# Patient Record
Sex: Female | Born: 1965 | Race: Black or African American | Hispanic: No | Marital: Married | Smoking: Never smoker
Health system: Southern US, Community
[De-identification: ages and names within clinical notes are randomized; demographics above are authoritative.]

## PROBLEM LIST (undated history)

## (undated) DIAGNOSIS — E669 Obesity, unspecified: Secondary | ICD-10-CM

## (undated) DIAGNOSIS — R519 Headache, unspecified: Secondary | ICD-10-CM

## (undated) DIAGNOSIS — R232 Flushing: Secondary | ICD-10-CM

## (undated) DIAGNOSIS — Z975 Presence of (intrauterine) contraceptive device: Secondary | ICD-10-CM

## (undated) DIAGNOSIS — R51 Headache: Secondary | ICD-10-CM

## (undated) DIAGNOSIS — N951 Menopausal and female climacteric states: Secondary | ICD-10-CM

## (undated) DIAGNOSIS — R61 Generalized hyperhidrosis: Secondary | ICD-10-CM

## (undated) DIAGNOSIS — D649 Anemia, unspecified: Secondary | ICD-10-CM

## (undated) DIAGNOSIS — E559 Vitamin D deficiency, unspecified: Secondary | ICD-10-CM

## (undated) HISTORY — DX: Headache: R51

## (undated) HISTORY — DX: Anemia, unspecified: D64.9

## (undated) HISTORY — DX: Generalized hyperhidrosis: R61

## (undated) HISTORY — DX: Presence of (intrauterine) contraceptive device: Z97.5

## (undated) HISTORY — DX: Flushing: R23.2

## (undated) HISTORY — DX: Vitamin D deficiency, unspecified: E55.9

## (undated) HISTORY — DX: Obesity, unspecified: E66.9

## (undated) HISTORY — DX: Headache, unspecified: R51.9

## (undated) HISTORY — DX: Menopausal and female climacteric states: N95.1

## (undated) HISTORY — PX: NO PAST SURGERIES: SHX2092

---

## 2001-05-03 ENCOUNTER — Encounter: Payer: Self-pay | Admitting: Family Medicine

## 2001-05-03 ENCOUNTER — Ambulatory Visit (HOSPITAL_COMMUNITY): Admission: RE | Admit: 2001-05-03 | Discharge: 2001-05-03 | Payer: Self-pay | Admitting: Family Medicine

## 2006-06-09 ENCOUNTER — Ambulatory Visit (HOSPITAL_COMMUNITY): Admission: RE | Admit: 2006-06-09 | Discharge: 2006-06-09 | Payer: Self-pay | Admitting: Pediatrics

## 2007-06-12 ENCOUNTER — Ambulatory Visit (HOSPITAL_COMMUNITY): Admission: RE | Admit: 2007-06-12 | Discharge: 2007-06-12 | Payer: Self-pay | Admitting: Pediatrics

## 2008-05-13 ENCOUNTER — Other Ambulatory Visit: Admission: RE | Admit: 2008-05-13 | Discharge: 2008-05-13 | Payer: Self-pay | Admitting: Obstetrics & Gynecology

## 2008-06-13 ENCOUNTER — Ambulatory Visit (HOSPITAL_COMMUNITY): Admission: RE | Admit: 2008-06-13 | Discharge: 2008-06-13 | Payer: Self-pay | Admitting: Pediatrics

## 2009-06-15 ENCOUNTER — Ambulatory Visit (HOSPITAL_COMMUNITY): Admission: RE | Admit: 2009-06-15 | Discharge: 2009-06-15 | Payer: Self-pay | Admitting: Pediatrics

## 2009-10-06 ENCOUNTER — Other Ambulatory Visit: Admission: RE | Admit: 2009-10-06 | Discharge: 2009-10-06 | Payer: Self-pay | Admitting: Obstetrics & Gynecology

## 2010-05-24 ENCOUNTER — Other Ambulatory Visit (HOSPITAL_COMMUNITY): Payer: Self-pay | Admitting: Pediatrics

## 2010-05-24 DIAGNOSIS — Z139 Encounter for screening, unspecified: Secondary | ICD-10-CM

## 2010-06-21 ENCOUNTER — Ambulatory Visit (HOSPITAL_COMMUNITY)
Admission: RE | Admit: 2010-06-21 | Discharge: 2010-06-21 | Disposition: A | Discharge status: Home / Self Care | Payer: BC Managed Care – PPO | Source: Ambulatory Visit | Attending: Pediatrics | Admitting: Pediatrics

## 2010-06-21 DIAGNOSIS — Z1231 Encounter for screening mammogram for malignant neoplasm of breast: Secondary | ICD-10-CM | POA: Insufficient documentation

## 2010-06-21 DIAGNOSIS — Z139 Encounter for screening, unspecified: Secondary | ICD-10-CM

## 2010-10-11 ENCOUNTER — Other Ambulatory Visit (HOSPITAL_COMMUNITY)
Admission: RE | Admit: 2010-10-11 | Discharge: 2010-10-11 | Disposition: A | Discharge status: Home / Self Care | Payer: BC Managed Care – PPO | Source: Ambulatory Visit | Attending: Obstetrics & Gynecology | Admitting: Obstetrics & Gynecology

## 2010-10-11 DIAGNOSIS — Z01419 Encounter for gynecological examination (general) (routine) without abnormal findings: Secondary | ICD-10-CM | POA: Insufficient documentation

## 2011-07-01 ENCOUNTER — Other Ambulatory Visit (HOSPITAL_COMMUNITY): Payer: Self-pay | Admitting: Pediatrics

## 2011-07-01 DIAGNOSIS — Z139 Encounter for screening, unspecified: Secondary | ICD-10-CM

## 2011-07-07 ENCOUNTER — Ambulatory Visit (HOSPITAL_COMMUNITY)
Admission: RE | Admit: 2011-07-07 | Discharge: 2011-07-07 | Disposition: A | Discharge status: Home / Self Care | Payer: BC Managed Care – PPO | Source: Ambulatory Visit | Attending: Pediatrics | Admitting: Pediatrics

## 2011-07-07 DIAGNOSIS — Z1231 Encounter for screening mammogram for malignant neoplasm of breast: Secondary | ICD-10-CM | POA: Insufficient documentation

## 2011-07-07 DIAGNOSIS — Z139 Encounter for screening, unspecified: Secondary | ICD-10-CM

## 2011-12-01 ENCOUNTER — Other Ambulatory Visit (HOSPITAL_COMMUNITY)
Admission: RE | Admit: 2011-12-01 | Discharge: 2011-12-01 | Disposition: A | Discharge status: Home / Self Care | Payer: BC Managed Care – PPO | Source: Ambulatory Visit | Attending: Obstetrics and Gynecology | Admitting: Obstetrics and Gynecology

## 2011-12-01 DIAGNOSIS — Z01419 Encounter for gynecological examination (general) (routine) without abnormal findings: Secondary | ICD-10-CM | POA: Insufficient documentation

## 2011-12-01 DIAGNOSIS — Z1151 Encounter for screening for human papillomavirus (HPV): Secondary | ICD-10-CM | POA: Insufficient documentation

## 2012-09-19 ENCOUNTER — Other Ambulatory Visit: Payer: Self-pay | Admitting: Adult Health

## 2012-09-19 DIAGNOSIS — Z139 Encounter for screening, unspecified: Secondary | ICD-10-CM

## 2012-09-20 ENCOUNTER — Ambulatory Visit (HOSPITAL_COMMUNITY)
Admission: RE | Admit: 2012-09-20 | Discharge: 2012-09-20 | Disposition: A | Discharge status: Home / Self Care | Payer: BC Managed Care – PPO | Source: Ambulatory Visit | Attending: Adult Health | Admitting: Adult Health

## 2012-09-20 DIAGNOSIS — Z1231 Encounter for screening mammogram for malignant neoplasm of breast: Secondary | ICD-10-CM | POA: Insufficient documentation

## 2012-09-20 DIAGNOSIS — Z139 Encounter for screening, unspecified: Secondary | ICD-10-CM

## 2012-10-03 ENCOUNTER — Encounter: Payer: Self-pay | Admitting: Family Medicine

## 2012-10-03 ENCOUNTER — Ambulatory Visit (HOSPITAL_COMMUNITY)
Admission: RE | Admit: 2012-10-03 | Discharge: 2012-10-03 | Disposition: A | Discharge status: Home / Self Care | Payer: BC Managed Care – PPO | Source: Ambulatory Visit | Attending: Family Medicine | Admitting: Family Medicine

## 2012-10-03 ENCOUNTER — Ambulatory Visit (INDEPENDENT_AMBULATORY_CARE_PROVIDER_SITE_OTHER): Payer: BC Managed Care – PPO | Admitting: Family Medicine

## 2012-10-03 VITALS — BP 126/74 | Ht 67.0 in | Wt 211.4 lb

## 2012-10-03 DIAGNOSIS — R509 Fever, unspecified: Secondary | ICD-10-CM | POA: Insufficient documentation

## 2012-10-03 DIAGNOSIS — R61 Generalized hyperhidrosis: Secondary | ICD-10-CM | POA: Insufficient documentation

## 2012-10-03 DIAGNOSIS — R110 Nausea: Secondary | ICD-10-CM

## 2012-10-03 LAB — POCT URINE PREGNANCY: Preg Test, Ur: NEGATIVE

## 2012-10-03 LAB — POCT URINALYSIS DIPSTICK
Bilirubin, UA: NEGATIVE
Ketones, UA: NEGATIVE
Leukocytes, UA: NEGATIVE
Nitrite, UA: NEGATIVE

## 2012-10-03 LAB — CBC WITH DIFFERENTIAL/PLATELET
Basophils Absolute: 0 10*3/uL (ref 0.0–0.1)
Basophils Relative: 0 % (ref 0–1)
Eosinophils Relative: 2 % (ref 0–5)
HCT: 35.9 % — ABNORMAL LOW (ref 36.0–46.0)
Lymphocytes Relative: 38 % (ref 12–46)
MCHC: 32.3 g/dL (ref 30.0–36.0)
MCV: 86.5 fL (ref 78.0–100.0)
Monocytes Absolute: 0.3 10*3/uL (ref 0.1–1.0)
Platelets: 243 10*3/uL (ref 150–400)
RDW: 13 % (ref 11.5–15.5)
WBC: 4.9 10*3/uL (ref 4.0–10.5)

## 2012-10-03 NOTE — Progress Notes (Deleted)
CC - headaches and night sweats for 3-4 days  HPI -

## 2012-10-03 NOTE — Patient Instructions (Signed)
Fever   Fever is a higher-than-normal body temperature. A normal temperature varies with:   Age.   How it is measured (mouth, underarm, rectal, or ear).   Time of day.  In an adult, an oral temperature around 98.6 Fahrenheit (F) or 37 Celsius (C) is considered normal. A rise in temperature of about 1.8 F or 1 C is generally considered a fever (100.4 F or 38 C). In an infant age 49 days or less, a rectal temperature of 100.4 F (38 C) generally is regarded as fever. Fever is not a disease but can be a symptom of illness.  CAUSES    Fever is most commonly caused by infection.   Some non-infectious problems can cause fever. For example:   Some arthritis problems.   Problems with the thyroid or adrenal glands.   Immune system problems.   Some kinds of cancer.   A reaction to certain medicines.   Occasionally, the source of a fever cannot be determined. This is sometimes called a "Fever of Unknown Origin" (FUO).   Some situations may lead to a temporary rise in body temperature that may go away on its own. Examples are:   Childbirth.   Surgery.   Some situations may cause a rise in body temperature but these are not considered "true fever". Examples are:   Intense exercise.   Dehydration.   Exposure to high outside or room temperatures.  SYMPTOMS    Feeling warm or hot.   Fatigue or feeling exhausted.   Aching all over.   Chills.   Shivering.   Sweats.  DIAGNOSIS   A fever can be suspected by your caregiver feeling that your skin is unusually warm. The fever is confirmed by taking a temperature with a thermometer. Temperatures can be taken different ways. Some methods are accurate and some are not:  With adults, adolescents, and children:    An oral temperature is used most commonly.   An ear thermometer will only be accurate if it is positioned as recommended by the manufacturer.   Under the arm temperatures are not accurate and not recommended.   Most electronic thermometers are fast  and accurate.  Infants and Toddlers:   Rectal temperatures are recommended and most accurate.   Ear temperatures are not accurate in this age group and are not recommended.   Skin thermometers are not accurate.  RISKS AND COMPLICATIONS    During a fever, the body uses more oxygen, so a person with a fever may develop rapid breathing or shortness of breath. This can be dangerous especially in people with heart or lung disease.   The sweats that occur following a fever can cause dehydration.   High fever can cause seizures in infants and children.   Older persons can develop confusion during a fever.  TREATMENT    Medications may be used to control temperature.   Do not give aspirin to children with fevers. There is an association with Reye's syndrome. Reye's syndrome is a rare but potentially deadly disease.   If an infection is present and medications have been prescribed, take them as directed. Finish the full course of medications until they are gone.   Sponging or bathing with room-temperature water may help reduce body temperature. Do not use ice water or alcohol sponge baths.   Do not over-bundle children in blankets or heavy clothes.   Drinking adequate fluids during an illness with fever is important to prevent dehydration.  HOME CARE INSTRUCTIONS      For adults, rest and adequate fluid intake are important. Dress according to how you feel, but do not over-bundle.   Drink enough water and/or fluids to keep your urine clear or pale yellow.   For infants over 3 months and children, giving medication as directed by your caregiver to control fever can help with comfort. The amount to be given is based on the child's weight. Do NOT give more than is recommended.  SEEK MEDICAL CARE IF:    You or your child are unable to keep fluids down.   Vomiting or diarrhea develops.   You develop a skin rash.   An oral temperature above 102 F (38.9 C) develops, or a fever which persists for over 3  days.   You develop excessive weakness, dizziness, fainting or extreme thirst.   Fevers keep coming back after 3 days.  SEEK IMMEDIATE MEDICAL CARE IF:    Shortness of breath or trouble breathing develops   You pass out.   You feel you are making little or no urine.   New pain develops that was not there before (such as in the head, neck, chest, back, or abdomen).   You cannot hold down fluids.   Vomiting and diarrhea persist for more than a day or two.   You develop a stiff neck and/or your eyes become sensitive to light.   An unexplained temperature above 102 F (38.9 C) develops.  Document Released: 01/17/2005 Document Revised: 04/11/2011 Document Reviewed: 01/03/2008  ExitCare Patient Information 2014 [employer].

## 2012-10-04 ENCOUNTER — Encounter: Payer: Self-pay | Admitting: Family Medicine

## 2012-10-04 LAB — COMPREHENSIVE METABOLIC PANEL
ALT: 16 U/L (ref 0–35)
AST: 20 U/L (ref 0–37)
CO2: 25 mEq/L (ref 19–32)
Calcium: 8.9 mg/dL (ref 8.4–10.5)
Chloride: 107 mEq/L (ref 96–112)
Sodium: 142 mEq/L (ref 135–145)
Total Bilirubin: 0.3 mg/dL (ref 0.3–1.2)
Total Protein: 6.5 g/dL (ref 6.0–8.3)

## 2012-10-04 NOTE — Progress Notes (Signed)
  Subjective:    Patient ID: Leslie Spears, female    DOB: 1965/07/16, 47 y.o.   MRN: [id number]  HPI Pt here with c/o headaches and night sweats for 3-4 days as well as malaise. She had simialr sx a few years ago and was told she had a bacterial infection but is not sure what type.  Headaches have been dull and bitemporal, no vision changes, asociated with nausea, relieved by rest but tend to be worse at night. She drinks no caffeiene and has not made any changes in caffeiene recently. She drinks enough water that her urine is near clear daily. No dietary changes.   night sweats 3-4 days, none before, has not had skipping periods or other perimenopausal sx (unsure age of her mom with menopause due to early hysterectomy). Feels very hot, gets up and stands under the fan. Has not required changing clothes or linens. Has not checked temperature.     Review of Systems  Respiratory: Negative for cough, chest tightness and shortness of breath.   Cardiovascular: Negative for chest pain and palpitations.  Genitourinary: Negative for dysuria.       Objective:   Physical Exam  Nursing note and vitals reviewed. Constitutional: She is oriented to person, place, and time. She appears well-developed and well-nourished.  Cardiovascular: Normal rate, regular rhythm and normal heart sounds.   Pulmonary/Chest: Effort normal and breath sounds normal.  Abdominal: Soft. Bowel sounds are normal. She exhibits no distension. There is no tenderness.  Neurological: She is alert and oriented to person, place, and time. She displays normal reflexes. No cranial nerve deficit. She exhibits normal muscle tone. Coordination normal.  Psychiatric: She has a normal mood and affect.          Assessment & Plan:  Night sweats - Plan: DG Chest 2 View  Fever, unspecified - Plan: POCT urinalysis dipstick, Comprehensive metabolic panel, CBC with Differential, TSH  Nausea alone - Plan: POCT urine pregnancy,  Comprehensive metabolic panel, CBC with Differential, Urine culture  Will have her rtc in 2 days to review labs and see how she is doing. May need some nausea meds for the weekend if not resolving as she is going to a trip.

## 2012-10-05 ENCOUNTER — Encounter: Payer: Self-pay | Admitting: Family Medicine

## 2012-10-05 ENCOUNTER — Ambulatory Visit (INDEPENDENT_AMBULATORY_CARE_PROVIDER_SITE_OTHER): Payer: BC Managed Care – PPO | Admitting: Family Medicine

## 2012-10-05 VITALS — BP 126/74 | Temp 97.8°F | Wt 210.0 lb

## 2012-10-05 DIAGNOSIS — D649 Anemia, unspecified: Secondary | ICD-10-CM

## 2012-10-05 DIAGNOSIS — Z87898 Personal history of other specified conditions: Secondary | ICD-10-CM

## 2012-10-05 DIAGNOSIS — R519 Headache, unspecified: Secondary | ICD-10-CM

## 2012-10-05 DIAGNOSIS — I517 Cardiomegaly: Secondary | ICD-10-CM

## 2012-10-05 DIAGNOSIS — R51 Headache: Secondary | ICD-10-CM

## 2012-10-05 DIAGNOSIS — A491 Streptococcal infection, unspecified site: Secondary | ICD-10-CM

## 2012-10-05 DIAGNOSIS — B951 Streptococcus, group B, as the cause of diseases classified elsewhere: Secondary | ICD-10-CM

## 2012-10-05 LAB — URINE CULTURE

## 2012-10-05 MED ORDER — BUTALBITAL-APAP-CAFFEINE 50-325-40 MG PO TABS: ORAL_TABLET | ORAL | Status: DC

## 2012-10-05 MED ORDER — AMOXICILLIN 875 MG PO TABS: 875.0000 mg | ORAL_TABLET | Freq: Two times a day (BID) | ORAL | Status: DC

## 2012-10-05 MED ORDER — ONDANSETRON HCL 4 MG PO TABS: ORAL_TABLET | ORAL | Status: DC

## 2012-10-05 NOTE — Patient Instructions (Signed)

## 2012-10-05 NOTE — Progress Notes (Signed)
  Subjective:    Patient ID: Leslie Spears, female    DOB: 05-02-1965, 47 y.o.   MRN: [id number]  HPI Pt here to f/u on "feeling hot" and having headaches. Sx have not changed.   Ucx pos 40Ku for GBS. Pt says she has a h/o UTIs esp after interocurse and this fits that pattern. No frequency, burning, or back pain. She has not had a documented fever. No GI sx aside from the nausea.   Reviewed labs and cxr that we did as w/u for sx.   CXR - mild cardiomegaly she does not think she has a h/o htn (none today) but admits to not being regular with cpe's. No shob or cp. No h/o arrhythmia.   Anemia - mildly low h/h, no periods for 10 years due to being on mirena. No fatigue/colness.   Pt due for cpe. Has h/o low vit D.        Review of Systems per hpi     Objective:   Physical Exam  Nursing note and vitals reviewed. Constitutional: She is oriented to person, place, and time. She appears well-developed and well-nourished.  Cardiovascular: Normal rate and regular rhythm.   Pulmonary/Chest: Effort normal and breath sounds normal.  Abdominal: Soft.  Neurological: She is alert and oriented to person, place, and time. She has normal reflexes.  Skin: Skin is warm and dry.  Psychiatric: She has a normal mood and affect.          Assessment & Plan:  Night sweat/feeling hot - ssupect otherwise asymptomatic uti with ucx being pos for gbs. Will treat with amox. If sx persist will need to investigate further  Cardiomegaly - mild, seen on cxr, will get echo to evaluate further  Anemia - checking anemia panel  Will have her rtc in a few weeks for cpe  Given script for zofran as she is going on long trip and has h/o motion sickness. No h/o arhthymia.

## 2012-10-05 NOTE — Addendum Note (Signed)
Addended by: Rolena Infante on: 10/05/2012 01:39 PM   Modules accepted: Orders

## 2012-10-06 LAB — ANEMIA PANEL
%SAT: 31 % (ref 20–55)
ABS Retic: 50.2 10*3/uL (ref 19.0–186.0)
Iron: 96 ug/dL (ref 42–145)
TIBC: 309 ug/dL (ref 250–470)
UIBC: 213 ug/dL (ref 125–400)
Vitamin B-12: 410 pg/mL (ref 211–911)

## 2012-10-22 ENCOUNTER — Encounter: Payer: Self-pay | Admitting: Adult Health

## 2012-10-22 ENCOUNTER — Ambulatory Visit (INDEPENDENT_AMBULATORY_CARE_PROVIDER_SITE_OTHER): Payer: BC Managed Care – PPO | Admitting: Adult Health

## 2012-10-22 VITALS — BP 120/78 | Ht 67.0 in | Wt 215.0 lb

## 2012-10-22 DIAGNOSIS — N951 Menopausal and female climacteric states: Secondary | ICD-10-CM

## 2012-10-22 DIAGNOSIS — R51 Headache: Secondary | ICD-10-CM

## 2012-10-22 DIAGNOSIS — R232 Flushing: Secondary | ICD-10-CM | POA: Insufficient documentation

## 2012-10-22 DIAGNOSIS — R519 Headache, unspecified: Secondary | ICD-10-CM | POA: Insufficient documentation

## 2012-10-22 HISTORY — DX: Flushing: R23.2

## 2012-10-22 MED ORDER — PROGESTERONE MICRONIZED 200 MG PO CAPS: 200.0000 mg | ORAL_CAPSULE | Freq: Every day | ORAL | Status: DC

## 2012-10-22 MED ORDER — ESTRADIOL 0.1 MG/24HR TD PTTW: 1.0000 | MEDICATED_PATCH | TRANSDERMAL | Status: DC

## 2012-10-22 NOTE — Progress Notes (Signed)
Subjective:     Patient ID: ELLSIE Spears, female   DOB: 1965-04-22, 47 y.o.   MRN: [id number]  HPI Leslie Spears is a 47 year old black female in complaining of headaches and being hot at night and not sleeping well.Has mirena IUD but it should have been removed and reinserted in April.Has seen Dr Joseph Art and had labs.Spots but no periods.  Review of Systems See HPI Reviewed past medical,surgical, social and family history. Reviewed medications and allergies.     Objective:   Physical Exam BP 120/78  Ht 5\' 7"  (1.702 m)  Wt 215 lb (97.523 kg)  BMI 33.67 kg/m2   Talk only, discussed HRT and she wants to try, will get back in to have IUD removed and reinserted with Dr Despina Hidden.Discussed with him. Discussed that with the IUD out of date that may contribute to her symptoms.  Assessment:     Hot flashes Headaches HRT    Plan:    Use condoms Rx minivelle 0.1 mg 1 patch 2 x weekly #8 with 12 refill and will rx prometrium 200 mg 1 at hs #30 with 1 refill til IUD removed and reinserted in 2 weeks Review handout on menopause and keep pap and physical appt in October Call prn

## 2012-10-22 NOTE — Patient Instructions (Signed)

## 2012-11-01 ENCOUNTER — Encounter: Payer: Self-pay | Admitting: Advanced Practice Midwife

## 2012-11-01 ENCOUNTER — Ambulatory Visit (INDEPENDENT_AMBULATORY_CARE_PROVIDER_SITE_OTHER): Payer: BC Managed Care – PPO | Admitting: Advanced Practice Midwife

## 2012-11-01 VITALS — BP 120/82 | Ht 67.0 in | Wt 213.0 lb

## 2012-11-01 DIAGNOSIS — Z30430 Encounter for insertion of intrauterine contraceptive device: Secondary | ICD-10-CM

## 2012-11-01 DIAGNOSIS — Z30432 Encounter for removal of intrauterine contraceptive device: Secondary | ICD-10-CM

## 2012-11-01 DIAGNOSIS — Z3202 Encounter for pregnancy test, result negative: Secondary | ICD-10-CM

## 2012-11-01 LAB — POCT URINE PREGNANCY: Preg Test, Ur: NEGATIVE

## 2012-11-01 NOTE — Progress Notes (Signed)
Leslie Spears is a 47 y.o. year old African American female   who presents for removal and replacement of a Mirena IUD.  It has been 5 1/2 years since her previous IUD placement. She has been on prometrium for uterine stabilization and Mirelle for hot flashes fo r2 weeks.  Is noticing an improvement in the flashes already.  The risks and benefits of the method and placement have been thouroughly reviewed with the patient and all questions were answered.  Specifically the patient is aware of failure rate of 01/998, expulsion of the IUD and of possible perforation.  The patient is aware of irregular bleeding due to the method and understands the incidence of irregular bleeding diminishes with time.   Time out was performed.  A Graves speculum was placed.  The cervix was prepped using Betadine. The strings were found to be  visible.   They were grasped and the Mirena was easily removed. The cervix was then grasped with a tenaculum and the uterus was sounded to 7 cm. The IUD was inserted to 7 cm.  It was pulled back 1 cm and the IUD was disengaged.  The strings were trimmed to 3 cm.  Sonogram was performed and the proper placement of the IUD was verified.  The patient was instructed on signs and symptoms of infection and to check for the strings after each menses or each month.  The patient is to refrain from intercourse for 3 days.

## 2012-11-30 ENCOUNTER — Ambulatory Visit (INDEPENDENT_AMBULATORY_CARE_PROVIDER_SITE_OTHER): Payer: BC Managed Care – PPO | Admitting: Adult Health

## 2012-11-30 ENCOUNTER — Encounter: Payer: BC Managed Care – PPO | Admitting: Family Medicine

## 2012-11-30 ENCOUNTER — Encounter: Payer: Self-pay | Admitting: Adult Health

## 2012-11-30 VITALS — BP 150/84 | HR 78 | Ht 66.0 in | Wt 220.5 lb

## 2012-11-30 DIAGNOSIS — Z1212 Encounter for screening for malignant neoplasm of rectum: Secondary | ICD-10-CM

## 2012-11-30 DIAGNOSIS — E559 Vitamin D deficiency, unspecified: Secondary | ICD-10-CM

## 2012-11-30 DIAGNOSIS — Z975 Presence of (intrauterine) contraceptive device: Secondary | ICD-10-CM

## 2012-11-30 DIAGNOSIS — Z01419 Encounter for gynecological examination (general) (routine) without abnormal findings: Secondary | ICD-10-CM

## 2012-11-30 HISTORY — DX: Vitamin D deficiency, unspecified: E55.9

## 2012-11-30 LAB — HEMOCCULT GUIAC POC 1CARD (OFFICE): Fecal Occult Blood, POC: NEGATIVE

## 2012-11-30 NOTE — Patient Instructions (Signed)
Physical in 1 year Mammogram yearly Colonoscopy at 48  

## 2012-11-30 NOTE — Progress Notes (Signed)
Patient ID: Leslie Spears, female   DOB: [birth year], 47 y.o.   MRN: [id number] History of Present Illness: Leslie Spears is a 47 year old black female married in for physical, had normal pap 12/01/11 with negative HPV.   Current Medications, Allergies, Past Medical History, Past Surgical History, Family History and Social History were reviewed in [employer] record.     Review of Systems: Patient denies any headaches, blurred vision, shortness of breath, chest pain, abdominal pain, problems with bowel movements, urination, or intercourse. No joint pain or mood swings, she stopped the patch after IUD inserted was having some nausea.    Physical Exam:BP 150/84  Pulse 78  Ht 5\' 6"  (1.676 m)  Wt 220 lb 8 oz (100.018 kg)  BMI 35.61 kg/m2 General:  Well developed, well nourished, no acute distress Skin:  Warm and dry Neck:  Midline trachea, normal thyroid Lungs; Clear to auscultation bilaterally Breast:  No dominant palpable mass, retraction, or nipple discharge Cardiovascular: Regular rate and rhythm Abdomen:  Soft, non tender, no hepatosplenomegaly Pelvic:  External genitalia is normal in appearance.  The vagina is normal in appearance.  The cervix is bulbous. +IUD strings. Uterus is felt to be normal size, shape, and contour.  No adnexal masses or tenderness noted. Rectal: Good sphincter tone, no polyps, or hemorrhoids felt.  Hemoccult negative. Extremities:  No swelling or varicosities noted Psych:  No mood changes, alert and cooperative   Impression: Yearly exam no pap IUD in place History vitamin deficiency   Plan: Physical in 1 year Mammogram yearly Colonoscopy at 48  Labs at PCP

## 2013-09-09 ENCOUNTER — Other Ambulatory Visit: Payer: Self-pay | Admitting: Adult Health

## 2013-09-09 DIAGNOSIS — Z1231 Encounter for screening mammogram for malignant neoplasm of breast: Secondary | ICD-10-CM

## 2013-09-27 ENCOUNTER — Ambulatory Visit (HOSPITAL_COMMUNITY): Payer: BC Managed Care – PPO

## 2013-10-04 ENCOUNTER — Ambulatory Visit (HOSPITAL_COMMUNITY)
Admission: RE | Admit: 2013-10-04 | Discharge: 2013-10-04 | Disposition: A | Discharge status: Home / Self Care | Payer: BC Managed Care – PPO | Source: Ambulatory Visit | Attending: Adult Health | Admitting: Adult Health

## 2013-10-04 DIAGNOSIS — Z1231 Encounter for screening mammogram for malignant neoplasm of breast: Secondary | ICD-10-CM | POA: Diagnosis not present

## 2013-10-29 ENCOUNTER — Encounter (INDEPENDENT_AMBULATORY_CARE_PROVIDER_SITE_OTHER): Payer: Self-pay | Admitting: *Deleted

## 2013-12-02 ENCOUNTER — Encounter: Payer: Self-pay | Admitting: Adult Health

## 2013-12-04 ENCOUNTER — Other Ambulatory Visit: Payer: BC Managed Care – PPO | Admitting: Adult Health

## 2013-12-10 ENCOUNTER — Encounter: Payer: Self-pay | Admitting: Adult Health

## 2013-12-10 ENCOUNTER — Ambulatory Visit (INDEPENDENT_AMBULATORY_CARE_PROVIDER_SITE_OTHER): Payer: BC Managed Care – PPO | Admitting: Adult Health

## 2013-12-10 VITALS — BP 118/72 | HR 78 | Ht 66.0 in | Wt 222.0 lb

## 2013-12-10 DIAGNOSIS — Z1212 Encounter for screening for malignant neoplasm of rectum: Secondary | ICD-10-CM

## 2013-12-10 DIAGNOSIS — R232 Flushing: Secondary | ICD-10-CM

## 2013-12-10 DIAGNOSIS — Z01419 Encounter for gynecological examination (general) (routine) without abnormal findings: Secondary | ICD-10-CM

## 2013-12-10 DIAGNOSIS — N951 Menopausal and female climacteric states: Secondary | ICD-10-CM

## 2013-12-10 DIAGNOSIS — Z975 Presence of (intrauterine) contraceptive device: Secondary | ICD-10-CM

## 2013-12-10 HISTORY — DX: Menopausal and female climacteric states: N95.1

## 2013-12-10 HISTORY — DX: Presence of (intrauterine) contraceptive device: Z97.5

## 2013-12-10 LAB — HEMOCCULT GUIAC POC 1CARD (OFFICE): FECAL OCCULT BLD: NEGATIVE

## 2013-12-10 NOTE — Progress Notes (Signed)
Patient ID: Leslie PinksGloria Spears, female   DOB: 07/09/1965, 48 y.o.   MRN: [id number] History of Present Illness: Leslie BondsGloria is a 48 year old black female in for gyn exam.Had normal pap with negative HPV 12/01/11.She has an IUD,it was placed 11/01/12.had spotting about a month ago,but usually no periods, and this is her 3rd IUD.   Current Medications, Allergies, Past Medical History, Past Surgical History, Family History and Social History were reviewed in [facility].     Review of Systems: Patient denies any headaches, blurred vision, shortness of breath, chest pain, abdominal pain, problems with bowel movements, urination, or intercourse. No joint pain or mood swings, some hot flashes, no bad.She got flu shot with Dr Margo AyeHall and also had labs.    Physical Exam:BP 118/72 mmHg  Pulse 78  Ht 5\' 6"  (1.676 m)  Wt 222 lb (100.699 kg)  BMI 35.85 kg/m2 General:  Well developed, well nourished, no acute distress Skin:  Warm and dry Neck:  Midline trachea, normal thyroid Lungs; Clear to auscultation bilaterally Breast:  No dominant palpable mass, retraction, or nipple discharge Cardiovascular: Regular rate and rhythm Abdomen:  Soft, non tender, no hepatosplenomegaly Pelvic:  External genitalia is normal in appearance.  The vagina is normal in appearance. The cervix is bulbous, with + IUD strings at os..  Uterus is felt to be normal size, shape, and contour.  No                adnexal masses or tenderness noted. Rectal: Good sphincter tone, no polyps, or hemorrhoids felt.  Hemoccult negative. Extremities:  No swelling or varicosities noted Psych:  No mood changes,alert and cooperative,seems happy   Impression: Well woman gyn exam no pap IUD in place Peri menopause Hot flashes     Plan: Review handout on peri menopause Pap and physical in 1 year Mammogram yearly Colonoscopy advised Dr Margo AyeHall will set up referral

## 2013-12-10 NOTE — Patient Instructions (Signed)
Pap and physical in  1 year Mammogram yearly Colonoscopy advised Labs with PCP Perimenopause Perimenopause is the time when your body begins to move into the menopause (no menstrual period for 12 straight months). It is a natural process. Perimenopause can begin 2-8 years before the menopause and usually lasts for 1 year after the menopause. During this time, your ovaries may or may not produce an egg. The ovaries vary in their production of estrogen and progesterone hormones each month. This can cause irregular menstrual periods, difficulty getting pregnant, vaginal bleeding between periods, and uncomfortable symptoms. CAUSES  Irregular production of the ovarian hormones, estrogen and progesterone, and not ovulating every month.  Other causes include:  Tumor of the pituitary gland in the brain.  Medical disease that affects the ovaries.  Radiation treatment.  Chemotherapy.  Unknown causes.  Heavy smoking and excessive alcohol intake can bring on perimenopause sooner. SIGNS AND SYMPTOMS   Hot flashes.  Night sweats.  Irregular menstrual periods.  Decreased sex drive.  Vaginal dryness.  Headaches.  Mood swings.  Depression.  Memory problems.  Irritability.  Tiredness.  Weight gain.  Trouble getting pregnant.  The beginning of losing bone cells (osteoporosis).  The beginning of hardening of the arteries (atherosclerosis). DIAGNOSIS  Your health care provider will make a diagnosis by analyzing your age, menstrual history, and symptoms. He or she will do a physical exam and note any changes in your body, especially your female organs. Female hormone tests may or may not be helpful depending on the amount of female hormones you produce and when you produce them. However, other hormone tests may be helpful to rule out other problems. TREATMENT  In some cases, no treatment is needed. The decision on whether treatment is necessary during the perimenopause should be  made by you and your health care provider based on how the symptoms are affecting you and your lifestyle. Various treatments are available, such as:  Treating individual symptoms with a specific medicine for that symptom.  Herbal medicines that can help specific symptoms.  Counseling.  Group therapy. HOME CARE INSTRUCTIONS   Keep track of your menstrual periods (when they occur, how heavy they are, how long between periods, and how long they last) as well as your symptoms and when they started.  Only take over-the-counter or prescription medicines as directed by your health care provider.  Sleep and rest.  Exercise.  Eat a diet that contains calcium (good for your bones) and soy (acts like the estrogen hormone).  Do not smoke.  Avoid alcoholic beverages.  Take vitamin supplements as recommended by your health care provider. Taking vitamin E may help in certain cases.  Take calcium and vitamin D supplements to help prevent bone loss.  Group therapy is sometimes helpful.  Acupuncture may help in some cases. SEEK MEDICAL CARE IF:   You have questions about any symptoms you are having.  You need a referral to a specialist (gynecologist, psychiatrist, or psychologist). SEEK IMMEDIATE MEDICAL CARE IF:   You have vaginal bleeding.  Your period lasts longer than 8 days.  Your periods are recurring sooner than 21 days.  You have bleeding after intercourse.  You have severe depression.  You have pain when you urinate.  You have severe headaches.  You have vision problems. Document Released: 02/25/2004 Document Revised: 11/07/2012 Document Reviewed: 08/16/2012 [facility] Patient Information 2015 [place], [place]. This information is not intended to replace advice given to you by your health care provider. Make sure you discuss  any questions you have with your health care provider.  

## 2014-10-20 ENCOUNTER — Other Ambulatory Visit (INDEPENDENT_AMBULATORY_CARE_PROVIDER_SITE_OTHER): Payer: Self-pay | Admitting: *Deleted

## 2014-10-20 ENCOUNTER — Other Ambulatory Visit: Payer: Self-pay | Admitting: Adult Health

## 2014-10-20 ENCOUNTER — Encounter (INDEPENDENT_AMBULATORY_CARE_PROVIDER_SITE_OTHER): Payer: Self-pay | Admitting: *Deleted

## 2014-10-20 DIAGNOSIS — Z1231 Encounter for screening mammogram for malignant neoplasm of breast: Secondary | ICD-10-CM

## 2014-10-20 DIAGNOSIS — Z1211 Encounter for screening for malignant neoplasm of colon: Secondary | ICD-10-CM

## 2014-10-29 ENCOUNTER — Ambulatory Visit (HOSPITAL_COMMUNITY)
Admission: RE | Admit: 2014-10-29 | Discharge: 2014-10-29 | Disposition: A | Discharge status: Home / Self Care | Payer: 59 | Source: Ambulatory Visit | Attending: Adult Health | Admitting: Adult Health

## 2014-10-29 DIAGNOSIS — Z1231 Encounter for screening mammogram for malignant neoplasm of breast: Secondary | ICD-10-CM | POA: Insufficient documentation

## 2014-12-01 ENCOUNTER — Telehealth (INDEPENDENT_AMBULATORY_CARE_PROVIDER_SITE_OTHER): Payer: Self-pay | Admitting: *Deleted

## 2014-12-01 DIAGNOSIS — Z1211 Encounter for screening for malignant neoplasm of colon: Secondary | ICD-10-CM

## 2014-12-01 MED ORDER — PEG 3350-KCL-NA BICARB-NACL 420 G PO SOLR: 4000.0000 mL | Freq: Once | ORAL | Status: DC

## 2014-12-01 NOTE — Telephone Encounter (Signed)
Patient needs trilyte 

## 2014-12-11 ENCOUNTER — Telehealth (INDEPENDENT_AMBULATORY_CARE_PROVIDER_SITE_OTHER): Payer: Self-pay | Admitting: *Deleted

## 2014-12-11 NOTE — Telephone Encounter (Signed)
Referring MD/PCP: hall   Procedure: tcs  Reason/Indication:  screening  Has patient had this procedure before?  no  If so, when, by whom and where?    Is there a family history of colon cancer?  no  Who?  What age when diagnosed?    Is patient diabetic?   no      Does patient have prosthetic heart valve or mechanical valve?  no  Do you have a pacemaker?  no  Has patient ever had endocarditis? no  Has patient had joint replacement within last 12 months?  no  Does patient tend to be constipated or take laxatives? no  Does patient have a history of alcohol/drug use?  no  Is patient on Coumadin, Plavix and/or Aspirin? no  Medications: none  Allergies: nkda  Medication Adjustment:   Procedure date & time: 01/01/15 at 830

## 2014-12-12 ENCOUNTER — Other Ambulatory Visit (HOSPITAL_COMMUNITY)
Admission: RE | Admit: 2014-12-12 | Discharge: 2014-12-12 | Disposition: A | Discharge status: Home / Self Care | Payer: 59 | Source: Ambulatory Visit | Attending: Adult Health | Admitting: Adult Health

## 2014-12-12 ENCOUNTER — Ambulatory Visit (INDEPENDENT_AMBULATORY_CARE_PROVIDER_SITE_OTHER): Payer: 59 | Admitting: Adult Health

## 2014-12-12 ENCOUNTER — Encounter: Payer: Self-pay | Admitting: Adult Health

## 2014-12-12 VITALS — BP 132/76 | HR 64 | Ht 66.0 in | Wt 224.0 lb

## 2014-12-12 DIAGNOSIS — Z01419 Encounter for gynecological examination (general) (routine) without abnormal findings: Secondary | ICD-10-CM

## 2014-12-12 DIAGNOSIS — Z975 Presence of (intrauterine) contraceptive device: Secondary | ICD-10-CM

## 2014-12-12 DIAGNOSIS — R232 Flushing: Secondary | ICD-10-CM

## 2014-12-12 DIAGNOSIS — R61 Generalized hyperhidrosis: Secondary | ICD-10-CM | POA: Insufficient documentation

## 2014-12-12 DIAGNOSIS — N951 Menopausal and female climacteric states: Secondary | ICD-10-CM | POA: Diagnosis not present

## 2014-12-12 DIAGNOSIS — Z1151 Encounter for screening for human papillomavirus (HPV): Secondary | ICD-10-CM | POA: Diagnosis not present

## 2014-12-12 DIAGNOSIS — R350 Frequency of micturition: Secondary | ICD-10-CM | POA: Diagnosis not present

## 2014-12-12 DIAGNOSIS — Z1211 Encounter for screening for malignant neoplasm of colon: Secondary | ICD-10-CM

## 2014-12-12 HISTORY — DX: Generalized hyperhidrosis: R61

## 2014-12-12 LAB — HEMOCCULT GUIAC POC 1CARD (OFFICE): Fecal Occult Blood, POC: NEGATIVE

## 2014-12-12 LAB — POCT URINALYSIS DIPSTICK
KETONES UA: NEGATIVE
Leukocytes, UA: NEGATIVE
Nitrite, UA: NEGATIVE
Protein, UA: NEGATIVE
RBC UA: NEGATIVE

## 2014-12-12 NOTE — Telephone Encounter (Signed)
agree

## 2014-12-12 NOTE — Progress Notes (Signed)
Patient ID: Leslie Spears, female   DOB: 09/29/1965, 49 y.o.   MRN: [id number] History of Present Illness:  Leslie Spears is a 49 year old black female,married in for a well woman gyn exam and pap and is complaining of hot flashes and night sweats.She has an IUD.She had labs and flu shot at her physical with [facility].She has her colonoscopy next week with Dr Karilyn Cotaehman.  Current Medications, Allergies, Past Medical History, Past Surgical History, Family History and Social History were reviewed in [facility] electronic medical record.     Review of Systems:  Patient denies any headaches, hearing loss, fatigue, blurred vision, shortness of breath, chest pain, abdominal pain, problems with bowel movements, urination, or intercourse. No joint pain or mood swings.See HPI for positives.   Physical Exam:BP 132/76 mmHg  Pulse 64  Ht 5\' 6"  (1.676 m)  Wt 224 lb (101.606 kg)  BMI 36.17 kg/m2 urine dipstick negative General:  Well developed, well nourished, no acute distress Skin:  Warm and dry Neck:  Midline trachea, normal thyroid, good ROM, no lymphadenopathy Lungs; Clear to auscultation bilaterally Breast:  No dominant palpable mass, retraction, or nipple discharge Cardiovascular: Regular rate and rhythm Abdomen:  Soft, non tender, no hepatosplenomegaly Pelvic:  External genitalia is normal in appearance, no lesions.  The vagina is normal in appearance. Urethra has no lesions or masses. The cervix is bulbous,+IUD strings at os, pap with HPV performed.  Uterus is felt to be normal size, shape, and contour.  No adnexal masses or tenderness noted.Bladder is non tender, no masses felt. Rectal: Good sphincter tone, no polyps, or hemorrhoids felt.  Hemoccult negative. Extremities/musculoskeletal:  No swelling or varicosities noted, no clubbing or cyanosis Psych:  No mood changes, alert and cooperative,seems happy Discussed menopause and adding estrogen and exercise,she wants to try to increase  exercise and lose some weight before adding estrogen.She will call me if she desires to try estrogen.  Impression: Peri menopause Hot flashes Night sweats Well woman gyn exam with pap Urinary frequency IUD in place   Plan: Physical in  1 year Mammogram yearly Colonoscopy next week Labs with PCP Review handout on weight loss and menopause

## 2014-12-12 NOTE — Patient Instructions (Addendum)
Physical in 1 year Mammogram yearly Colonoscopy next week Labs with PCP Exercise regularly  Calorie Counting for Weight Loss Calories are energy you get from the things you eat and drink. Your body uses this energy to keep you going throughout the day. The number of calories you eat affects your weight. When you eat more calories than your body needs, your body stores the extra calories as fat. When you eat fewer calories than your body needs, your body burns fat to get the energy it needs. Calorie counting means keeping track of how many calories you eat and drink each day. If you make sure to eat fewer calories than your body needs, you should lose weight. In order for calorie counting to work, you will need to eat the number of calories that are right for you in a day to lose a healthy amount of weight per week. A healthy amount of weight to lose per week is usually 1-2 lb (0.5-0.9 kg). A dietitian can determine how many calories you need in a day and give you suggestions on how to reach your calorie goal.  WHAT IS MY MY PLAN? My goal is to have ____1500______ calories per day.  If I have this many calories per day, I should lose around ____1-2______ pounds per week. WHAT DO I NEED TO KNOW ABOUT CALORIE COUNTING? In order to meet your daily calorie goal, you will need to:  Find out how many calories are in each food you would like to eat. Try to do this before you eat.  Decide how much of the food you can eat.  Write down what you ate and how many calories it had. Doing this is called keeping a food log. WHERE DO I FIND CALORIE INFORMATION? The number of calories in a food can be found on a Nutrition Facts label. Note that all the information on a label is based on a specific serving of the food. If a food does not have a Nutrition Facts label, try to look up the calories online or ask your dietitian for help. HOW DO I DECIDE HOW MUCH TO EAT? To decide how much of the food you can eat, you  will need to consider both the number of calories in one serving and the size of one serving. This information can be found on the Nutrition Facts label. If a food does not have a Nutrition Facts label, look up the information online or ask your dietitian for help. Remember that calories are listed per serving. If you choose to have more than one serving of a food, you will have to multiply the calories per serving by the amount of servings you plan to eat. For example, the label on a package of bread might say that a serving size is 1 slice and that there are 90 calories in a serving. If you eat 1 slice, you will have eaten 90 calories. If you eat 2 slices, you will have eaten 180 calories. HOW DO I KEEP A FOOD LOG? After each meal, record the following information in your food log:  What you ate.  How much of it you ate.  How many calories it had.  Then, add up your calories. Keep your food log near you, such as in a small notebook in your pocket. Another option is to use a mobile app or website. Some programs will calculate calories for you and show you how many calories you have left each time you add an item  to the log. WHAT ARE SOME CALORIE COUNTING TIPS?  Use your calories on foods and drinks that will fill you up and not leave you hungry. Some examples of this include foods like nuts and nut butters, vegetables, lean proteins, and high-fiber foods (more than 5 g fiber per serving).  Eat nutritious foods and avoid empty calories. Empty calories are calories you get from foods or beverages that do not have many nutrients, such as candy and soda. It is better to have a nutritious high-calorie food (such as an avocado) than a food with few nutrients (such as a bag of chips).  Know how many calories are in the foods you eat most often. This way, you do not have to look up how many calories they have each time you eat them.  Look out for foods that may seem like low-calorie foods but are  really high-calorie foods, such as baked goods, soda, and fat-free candy.  Pay attention to calories in drinks. Drinks such as sodas, specialty coffee drinks, alcohol, and juices have a lot of calories yet do not fill you up. Choose low-calorie drinks like water and diet drinks.  Focus your calorie counting efforts on higher calorie items. Logging the calories in a garden salad that contains only vegetables is less important than calculating the calories in a milk shake.  Find a way of tracking calories that works for you. Get creative. Most people who are successful find ways to keep track of how much they eat in a day, even if they do not count every calorie. WHAT ARE SOME PORTION CONTROL TIPS?  Know how many calories are in a serving. This will help you know how many servings of a certain food you can have.  Use a measuring cup to measure serving sizes. This is helpful when you start out. With time, you will be able to estimate serving sizes for some foods.  Take some time to put servings of different foods on your favorite plates, bowls, and cups so you know what a serving looks like.  Try not to eat straight from a bag or box. Doing this can lead to overeating. Put the amount you would like to eat in a cup or on a plate to make sure you are eating the right portion.  Use smaller plates, glasses, and bowls to prevent overeating. This is a quick and easy way to practice portion control. If your plate is smaller, less food can fit on it.  Try not to multitask while eating, such as watching TV or using your computer. If it is time to eat, sit down at a table and enjoy your food. Doing this will help you to start recognizing when you are full. It will also make you more aware of what and how much you are eating. HOW CAN I CALORIE COUNT WHEN EATING OUT?  Ask for smaller portion sizes or child-sized portions.  Consider sharing an entree and sides instead of getting your own entree.  If you  get your own entree, eat only half. Ask for a box at the beginning of your meal and put the rest of your entree in it so you are not tempted to eat it.  Look for the calories on the menu. If calories are listed, choose the lower calorie options.  Choose dishes that include vegetables, fruits, whole grains, low-fat dairy products, and lean protein. Focusing on smart food choices from each of the 5 food groups can help you stay on  track at restaurants.  Choose items that are boiled, broiled, grilled, or steamed.  Choose water, milk, unsweetened iced tea, or other drinks without added sugars. If you want an alcoholic beverage, choose a lower calorie option. For example, a regular margarita can have up to 700 calories and a glass of wine has around 150.  Stay away from items that are buttered, battered, fried, or served with cream sauce. Items labeled "crispy" are usually fried, unless stated otherwise.  Ask for dressings, sauces, and syrups on the side. These are usually very high in calories, so do not eat much of them.  Watch out for salads. Many people think salads are a healthy option, but this is often not the case. Many salads come with bacon, fried chicken, lots of cheese, fried chips, and dressing. All of these items have a lot of calories. If you want a salad, choose a garden salad and ask for grilled meats or steak. Ask for the dressing on the side, or ask for olive oil and vinegar or lemon to use as dressing.  Estimate how many servings of a food you are given. For example, a serving of cooked rice is  cup or about the size of half a tennis ball or one cupcake wrapper. Knowing serving sizes will help you be aware of how much food you are eating at restaurants. The list below tells you how big or small some common portion sizes are based on everyday objects.  1 oz--4 stacked dice.  3 oz--1 deck of cards.  1 tsp--1 dice.  1 Tbsp-- a Ping-Pong ball.  2 Tbsp--1 Ping-Pong ball.    cup--1 tennis ball or 1 cupcake wrapper.  1 cup--1 baseball.   This information is not intended to replace advice given to you by your health care provider. Make sure you discuss any questions you have with your health care provider.   Document Released: 01/17/2005 Document Revised: 02/07/2014 Document Reviewed: 11/22/2012 Elsevier Interactive Patient Education 2016 [employer] Menopause Menopause is the normal time of life when menstrual periods stop completely. Menopause is complete when you have missed 12 consecutive menstrual periods. It usually occurs between the ages of 48 years and 55 years. Very rarely does a woman develop menopause before the age of 40 years. At menopause, your ovaries stop producing the female hormones estrogen and progesterone. This can cause undesirable symptoms and also affect your health. Sometimes the symptoms may occur 4-5 years before the menopause begins. There is no relationship between menopause and: Oral contraceptives. Number of children you had. Race. The age your menstrual periods started (menarche). Heavy smokers and very thin women may develop menopause earlier in life. CAUSES The ovaries stop producing the female hormones estrogen and progesterone. Other causes include: Surgery to remove both ovaries. The ovaries stop functioning for no known reason. Tumors of the pituitary gland in the brain. Medical disease that affects the ovaries and hormone production. Radiation treatment to the abdomen or pelvis. Chemotherapy that affects the ovaries. SYMPTOMS  Hot flashes. Night sweats. Decrease in sex drive. Vaginal dryness and thinning of the vagina causing painful intercourse. Dryness of the skin and developing wrinkles. Headaches. Tiredness. Irritability. Memory problems. Weight gain. Bladder infections. Hair growth of the face and chest. Infertility. More serious symptoms include: Loss of bone (osteoporosis) causing breaks  (fractures). Depression. Hardening and narrowing of the arteries (atherosclerosis) causing heart attacks and strokes. DIAGNOSIS  When the menstrual periods have stopped for 12 straight months. Physical exam. Hormone studies of the  blood. TREATMENT  There are many treatment choices and nearly as many questions about them. The decisions to treat or not to treat menopausal changes is an individual choice made with your health care provider. Your health care provider can discuss the treatments with you. Together, you can decide which treatment will work best for you. Your treatment choices may include:  Hormone therapy (estrogen and progesterone). Non-hormonal medicines. Treating the individual symptoms with medicine (for example antidepressants for depression). Herbal medicines that may help specific symptoms. Counseling by a psychiatrist or psychologist. Group therapy. Lifestyle changes including: Eating healthy. Regular exercise. Limiting caffeine and alcohol. Stress management and meditation. No treatment. HOME CARE INSTRUCTIONS  Take the medicine your health care provider gives you as directed. Get plenty of sleep and rest. Exercise regularly. Eat a diet that contains calcium (good for the bones) and soy products (acts like estrogen hormone). Avoid alcoholic beverages. Do not smoke. If you have hot flashes, dress in layers. Take supplements, calcium, and vitamin D to strengthen bones. You can use over-the-counter lubricants or moisturizers for vaginal dryness. Group therapy is sometimes very helpful. Acupuncture may be helpful in some cases. SEEK MEDICAL CARE IF:  You are not sure you are in menopause. You are having menopausal symptoms and need advice and treatment. You are still having menstrual periods after age 17 years. You have pain with intercourse. Menopause is complete (no menstrual period for 12 months) and you develop vaginal bleeding. You need a referral to a  specialist (gynecologist, psychiatrist, or psychologist) for treatment. SEEK IMMEDIATE MEDICAL CARE IF:  You have severe depression. You have excessive vaginal bleeding. You fell and think you have a broken bone. You have pain when you urinate. You develop leg or chest pain. You have a fast pounding heart beat (palpitations). You have severe headaches. You develop vision problems. You feel a lump in your breast. You have abdominal pain or severe indigestion.   This information is not intended to replace advice given to you by your health care provider. Make sure you discuss any questions you have with your health care provider.   Document Released: 04/09/2003 Document Revised: 09/19/2012 Document Reviewed: 08/16/2012 Elsevier Interactive Patient Education [employer]

## 2014-12-15 LAB — CYTOLOGY - PAP

## 2015-01-01 ENCOUNTER — Encounter (HOSPITAL_COMMUNITY)
Admission: RE | Disposition: A | Discharge status: Home / Self Care | Payer: Self-pay | Source: Ambulatory Visit | Attending: Internal Medicine

## 2015-01-01 ENCOUNTER — Ambulatory Visit (HOSPITAL_COMMUNITY)
Admission: RE | Admit: 2015-01-01 | Discharge: 2015-01-01 | Disposition: A | Discharge status: Home / Self Care | Payer: 59 | Source: Ambulatory Visit | Attending: Internal Medicine | Admitting: Internal Medicine

## 2015-01-01 ENCOUNTER — Encounter (HOSPITAL_COMMUNITY): Payer: Self-pay | Admitting: *Deleted

## 2015-01-01 DIAGNOSIS — E559 Vitamin D deficiency, unspecified: Secondary | ICD-10-CM | POA: Insufficient documentation

## 2015-01-01 DIAGNOSIS — K648 Other hemorrhoids: Secondary | ICD-10-CM | POA: Insufficient documentation

## 2015-01-01 DIAGNOSIS — K644 Residual hemorrhoidal skin tags: Secondary | ICD-10-CM | POA: Insufficient documentation

## 2015-01-01 DIAGNOSIS — E669 Obesity, unspecified: Secondary | ICD-10-CM | POA: Diagnosis not present

## 2015-01-01 DIAGNOSIS — Z1211 Encounter for screening for malignant neoplasm of colon: Secondary | ICD-10-CM

## 2015-01-01 HISTORY — PX: COLONOSCOPY: SHX5424

## 2015-01-01 SURGERY — COLONOSCOPY
Anesthesia: Moderate Sedation

## 2015-01-01 MED ORDER — MIDAZOLAM HCL 5 MG/5ML IJ SOLN
INTRAMUSCULAR | Status: AC
  Filled 2015-01-01: qty 10

## 2015-01-01 MED ORDER — SODIUM CHLORIDE 0.9 % IV SOLN
INTRAVENOUS | Status: DC
  Administered 2015-01-01: 08:00:00 via INTRAVENOUS

## 2015-01-01 MED ORDER — STERILE WATER FOR IRRIGATION IR SOLN
Status: DC | PRN
  Administered 2015-01-01: 09:00:00

## 2015-01-01 MED ORDER — MEPERIDINE HCL 50 MG/ML IJ SOLN
INTRAMUSCULAR | Status: DC | PRN
  Administered 2015-01-01 (×2): 25 mg via INTRAVENOUS

## 2015-01-01 MED ORDER — MEPERIDINE HCL 50 MG/ML IJ SOLN
INTRAMUSCULAR | Status: DC
Start: 2015-01-01 — End: 2015-01-01
  Filled 2015-01-01: qty 1

## 2015-01-01 MED ORDER — MIDAZOLAM HCL 5 MG/5ML IJ SOLN
INTRAMUSCULAR | Status: DC | PRN
  Administered 2015-01-01: 1 mg via INTRAVENOUS
  Administered 2015-01-01: 2 mg via INTRAVENOUS
  Administered 2015-01-01: 1 mg via INTRAVENOUS
  Administered 2015-01-01: 2 mg via INTRAVENOUS

## 2015-01-01 NOTE — Discharge Instructions (Signed)
Resume usual medications and diet. °No driving for 24 hours. °Next screening exam in 10 years. ° ° ° ° °Colonoscopy, Care After °These instructions give you information on caring for yourself after your procedure. Your doctor may also give you more specific instructions. Call your doctor if you have any problems or questions after your procedure. °HOME CARE °· Do not drive for 24 hours. °· Do not sign important papers or use machinery for 24 hours. °· You may shower. °· You may go back to your usual activities, but go slower for the first 24 hours. °· Take rest breaks often during the first 24 hours. °· Walk around or use warm packs on your belly (abdomen) if you have belly cramping or gas. °· Drink enough fluids to keep your pee (urine) clear or pale yellow. °· Resume your normal diet. Avoid heavy or fried foods. °· Avoid drinking alcohol for 24 hours or as told by your doctor. °· Only take medicines as told by your doctor. °If a tissue sample (biopsy) was taken during the procedure:  °· Do not take aspirin or blood thinners for 7 days, or as told by your doctor. °· Do not drink alcohol for 7 days, or as told by your doctor. °· Eat soft foods for the first 24 hours. °GET HELP IF: °You still have a small amount of blood in your poop (stool) 2-3 days after the procedure. °GET HELP RIGHT AWAY IF: °· You have more than a small amount of blood in your poop. °· You see clumps of tissue (blood clots) in your poop. °· Your belly is puffy (swollen). °· You feel sick to your stomach (nauseous) or throw up (vomit). °· You have a fever. °· You have belly pain that gets worse and medicine does not help. °MAKE SURE YOU: °· Understand these instructions. °· Will watch your condition. °· Will get help right away if you are not doing well or get worse. °  °This information is not intended to replace advice given to you by your health care provider. Make sure you discuss any questions you have with your health care provider. °    °Document Released: 02/19/2010 Document Revised: 01/22/2013 Document Reviewed: 09/24/2012 °Elsevier Interactive Patient Education ©2016 Else[employer] Inc. ° ° ° ° ° °Hemorrhoids °Hemorrhoids are swollen veins around the rectum or anus. There are two types of hemorrhoids:  °· Internal hemorrhoids. These occur in the veins just inside the rectum. They may poke through to the outside and become irritated and painful. °· External hemorrhoids. These occur in the veins outside the anus and can be felt as a painful swelling or hard lump near the anus. °CAUSES °· Pregnancy.   °· Obesity.   °· Constipation or diarrhea.   °· Straining to have a bowel movement.   °· Sitting for long periods on the toilet. °· Heavy lifting or other activity that caused you to strain. °· Anal intercourse. °SYMPTOMS  °· Pain.   °· Anal itching or irritation.   °· Rectal bleeding.   °· Fecal leakage.   °· Anal swelling.   °· One or more lumps around the anus.   °DIAGNOSIS  °Your caregiver may be able to diagnose hemorrhoids by visual examination. Other examinations or tests that may be performed include:  °· Examination of the rectal area with a gloved hand (digital rectal exam).   °· Examination of anal canal using a small tube (scope).   °· A blood test if you have lost a significant amount of blood. °· A test to look inside the colon (  sigmoidoscopy or colonoscopy). °TREATMENT °Most hemorrhoids can be treated at home. However, if symptoms do not seem to be getting better or if you have a lot of rectal bleeding, your caregiver may perform a procedure to help make the hemorrhoids get smaller or remove them completely. Possible treatments include:  °· Placing a rubber band at the base of the hemorrhoid to cut off the circulation (rubber band ligation).   °· Injecting a chemical to shrink the hemorrhoid (sclerotherapy).   °· Using a tool to burn the hemorrhoid (infrared light therapy).   °· Surgically removing the hemorrhoid (hemorrhoidectomy).    °· Stapling the hemorrhoid to block blood flow to the tissue (hemorrhoid stapling).   °HOME CARE INSTRUCTIONS  °· Eat foods with fiber, such as whole grains, beans, nuts, fruits, and vegetables. Ask your doctor about taking products with added fiber in them (fiber supplements). °· Increase fluid intake. Drink enough water and fluids to keep your urine clear or pale yellow.   °· Exercise regularly.   °· Go to the bathroom when you have the urge to have a bowel movement. Do not wait.   °· Avoid straining to have bowel movements.   °· Keep the anal area dry and clean. Use wet toilet paper or moist towelettes after a bowel movement.   °· Medicated creams and suppositories may be used or applied as directed.   °· Only take over-the-counter or prescription medicines as directed by your caregiver.   °· Take warm sitz baths for 15-20 minutes, 3-4 times a day to ease pain and discomfort.   °· Place ice packs on the hemorrhoids if they are tender and swollen. Using ice packs between sitz baths may be helpful.   °¨ Put ice in a plastic bag.   °¨ Place a towel between your skin and the bag.   °¨ Leave the ice on for 15-20 minutes, 3-4 times a day.   °· Do not use a donut-shaped pillow or sit on the toilet for long periods. This increases blood pooling and pain.   °SEEK MEDICAL CARE IF: °· You have increasing pain and swelling that is not controlled by treatment or medicine. °· You have uncontrolled bleeding. °· You have difficulty or you are unable to have a bowel movement. °· You have pain or inflammation outside the area of the hemorrhoids. °MAKE SURE YOU: °· Understand these instructions. °· Will watch your condition. °· Will get help right away if you are not doing well or get worse. °  °This information is not intended to replace advice given to you by your health care provider. Make sure you discuss any questions you have with your health care provider. °  °Document Released: 01/15/2000 Document Revised: 01/04/2012  Document Reviewed: 11/22/2011 °Elsevier Interactive Patient Education ©2016 Elsevier Inc. ° °

## 2015-01-01 NOTE — Op Note (Signed)
COLONOSCOPY PROCEDURE REPORT  PATIENT:  Leslie PinksGloria Spears  MR#:  [id number] Birthdate:  01-25-1966, 49 y.o., female Endoscopist:  Dr. Malissa HippoNajeeb U. Rehman, MD Referred By:  Dr. Catalina PizzaZach Hall, MD Procedure Date: 01/01/2015  Procedure:   Colonoscopy  Indications:   Patient is 49 year old African-American female was undergoing average risk screening colonoscopy.  Informed Consent:  The procedure and risks were reviewed with the patient and informed consent was obtained.  Medications:  Demerol 50 mg IV Versed 5 mg IV  Description of procedure:  After a digital rectal exam was performed, that colonoscope was advanced from the anus through the rectum and colon to the area of the cecum, ileocecal valve and appendiceal orifice. The cecum was deeply intubated. These structures were well-seen and photographed for the record. From the level of the cecum and ileocecal valve, the scope was slowly and cautiously withdrawn. The mucosal surfaces were carefully surveyed utilizing scope tip to flexion to facilitate fold flattening as needed. The scope was pulled down into the rectum where a thorough exam including retroflexion was performed.  Findings:   Prep excellent.  normal mucosa of cecum, ascending colon, hepatic flexure, transverse colon, splenic flexure, descending and sigmoid colon.  Normal rectal mucosa.  Hemorrhoids noted above and below the dentate line.   Therapeutic/Diagnostic Maneuvers Performed:   None  Complications:   none  EBL: None  Cecal Withdrawal Time:   14 minutes  Impression:  Examination performed to cecum.  Internal and external hemorrhoids otherwise normal colonoscopy.  Recommendations:  Standard instructions given. Next screening exam in 10 years.  REHMAN,NAJEEB U  01/01/2015 9:09 AM  CC: Dr. Dwana MelenaZack Hall, MD & Dr. Bonnetta BarryNo ref. provider found

## 2015-01-01 NOTE — H&P (Signed)
Leslie PinksGloria Spears is an 49 y.o. female.   Chief Complaint:  Patient is here for colonoscopy. HPI:  Patient is 49 year old African-American female who is here for screening colonoscopy. She denies abdominal pain change in bowel habits or rectal bleeding.  History is negative for CRC.  Past Medical History  Diagnosis Date  . Anemia   . Frequent headaches   . Hot flashes 10/22/2012  . Obesity   . Vitamin D deficiency 11/30/2012  . IUD (intrauterine device) in place 12/10/2013  . Peri-menopause 12/10/2013  . Night sweats 12/12/2014    Past Surgical History  Procedure Laterality Date  . No past surgeries      Family History  Problem Relation Age of Onset  . Hypertension Mother   . Other Sister     on disability   Social History:  reports that she has never smoked. She has never used smokeless tobacco. She reports that she does not drink alcohol or use illicit drugs.  Allergies: No Known Allergies  Medications Prior to Admission  Medication Sig Dispense Refill  . cholecalciferol (VITAMIN D) 1000 UNITS tablet Take 1,000 Units by mouth daily. She takes 6,000IU per day    . polyethylene glycol-electrolytes (NULYTELY/GOLYTELY) 420 G solution Take 4,000 mLs by mouth once. 4000 mL 0  . levonorgestrel (MIRENA) 20 MCG/24HR IUD 1 each by Intrauterine route once.    . meclizine (ANTIVERT) 25 MG tablet Take 25 mg by mouth 2 (two) times daily as needed for dizziness.   0    No results found for this or any previous visit (from the past 48 hour(s)). No results found.  ROS  Blood pressure 125/75, pulse 57, temperature 98.9 F (37.2 C), temperature source Oral, resp. rate 18, height 5\' 7"  (1.702 m), weight 218 lb (98.884 kg), SpO2 100 %. Physical Exam  Constitutional: She appears well-developed and well-nourished.  HENT:  Mouth/Throat: Oropharynx is clear and moist.  Eyes: Conjunctivae are normal. No scleral icterus.  Neck: No thyromegaly present.  Cardiovascular: Normal rate, regular  rhythm and normal heart sounds.   No murmur heard. Respiratory: Effort normal and breath sounds normal.  GI: Soft. She exhibits no distension and no mass. There is no tenderness.  Musculoskeletal: She exhibits no edema.  Lymphadenopathy:    She has no cervical adenopathy.  Neurological: She is alert.  Skin: Skin is warm and dry.     Assessment/Plan  average risk screening colonoscopy.  Athalie Newhard U 01/01/2015, 8:29 AM

## 2015-01-02 ENCOUNTER — Encounter (HOSPITAL_COMMUNITY): Payer: Self-pay | Admitting: Internal Medicine

## 2015-11-24 ENCOUNTER — Other Ambulatory Visit: Payer: Self-pay | Admitting: Adult Health

## 2015-11-24 DIAGNOSIS — Z1231 Encounter for screening mammogram for malignant neoplasm of breast: Secondary | ICD-10-CM

## 2015-12-10 ENCOUNTER — Ambulatory Visit (HOSPITAL_COMMUNITY): Payer: Self-pay

## 2015-12-22 ENCOUNTER — Encounter (INDEPENDENT_AMBULATORY_CARE_PROVIDER_SITE_OTHER): Payer: Self-pay

## 2015-12-22 ENCOUNTER — Ambulatory Visit (INDEPENDENT_AMBULATORY_CARE_PROVIDER_SITE_OTHER): Payer: BLUE CROSS/BLUE SHIELD | Admitting: Adult Health

## 2015-12-22 ENCOUNTER — Encounter: Payer: Self-pay | Admitting: Adult Health

## 2015-12-22 VITALS — BP 120/80 | HR 61 | Ht 66.0 in | Wt 231.4 lb

## 2015-12-22 DIAGNOSIS — Z01411 Encounter for gynecological examination (general) (routine) with abnormal findings: Secondary | ICD-10-CM

## 2015-12-22 DIAGNOSIS — E6609 Other obesity due to excess calories: Secondary | ICD-10-CM

## 2015-12-22 DIAGNOSIS — Z6837 Body mass index (BMI) 37.0-37.9, adult: Secondary | ICD-10-CM

## 2015-12-22 DIAGNOSIS — Z01419 Encounter for gynecological examination (general) (routine) without abnormal findings: Secondary | ICD-10-CM

## 2015-12-22 DIAGNOSIS — Z1211 Encounter for screening for malignant neoplasm of colon: Secondary | ICD-10-CM

## 2015-12-22 DIAGNOSIS — Z975 Presence of (intrauterine) contraceptive device: Secondary | ICD-10-CM

## 2015-12-22 LAB — HEMOCCULT GUIAC POC 1CARD (OFFICE): FECAL OCCULT BLD: NEGATIVE

## 2015-12-22 NOTE — Progress Notes (Signed)
Patient ID: Leslie PinksGloria Spears, female   DOB: 01-Jan-1966, 50 y.o.   MRN: [id number] History of Present Illness: Leslie Spears is a 50 year old black female in for well woman gyn exam, had normal pap with negative HPV 12/12/14. PCP is Dr Leslie Spears.  Current Medications, Allergies, Past Medical History, Past Surgical History, Family History and Social History were reviewed in [facility] electronic medical record.     Review of Systems: Patient denies any headaches, hearing loss, fatigue, blurred vision, shortness of breath, chest pain, abdominal pain, problems with bowel movements, urination, or intercourse. No joint pain or mood swings.Has a few hot flashes, no periods with IUD, some stress caring for Mom who had knee surgery. She says she has gained weight in last year, but loves sodas and eating.    Physical Exam:BP 120/80 (BP Location: Left Arm, Cuff Size: Large)   Pulse 61   Ht 5\' 6"  (1.676 m)   Wt 231 lb 6.4 oz (105 kg)   BMI 37.35 kg/m  General:  Well developed, well nourished, no acute distress Skin:  Warm and dry Neck:  Midline trachea, normal thyroid, good ROM, no lymphadenopathy Lungs; Clear to auscultation bilaterally Breast:  No dominant palpable mass, retraction, or nipple discharge Cardiovascular: Regular rate and rhythm Abdomen:  Soft, non tender, no hepatosplenomegaly Pelvic:  External genitalia is normal in appearance, no lesions.  The vagina is normal in appearance. Urethra has no lesions or masses. The cervix is bulbous.+IUD strings seen.  Uterus is felt to be normal size, shape, and contour.  No adnexal masses or tenderness noted.Bladder is non tender, no masses felt. Rectal: Good sphincter tone, no polyps, or hemorrhoids felt.  Hemoccult negative. Extremities/musculoskeletal:  No swelling or varicosities noted, no clubbing or cyanosis Psych:  No mood changes, alert and cooperative,seems happy PHQ 2 score 0.  Impression: 1. Well female exam with routine gynecological exam    2. IUD (intrauterine device) in place   3. Class 2 obesity due to excess calories without serious comorbidity in adult, unspecified BMI       Plan: Check CBC,CMP,TSH and lipids,A1c and vitamin D Physical in 1 year Pap in 2019 and IUD removal(it was placed 12/02/12) Mammogram yearly Colonoscopy per GI Get flu shot  Try to work on weight, try to stop sodas  F/u with Dr Leslie Spears

## 2015-12-22 NOTE — Patient Instructions (Addendum)
Physical in 1 year Pap in 2019 and IUD removal Mammogram yearly Colonoscopy per GI Get flu shot  Try to work on weight  F/u with Dr Juanetta GoslingHawkins

## 2015-12-23 ENCOUNTER — Telehealth: Payer: Self-pay | Admitting: Adult Health

## 2015-12-23 LAB — COMPREHENSIVE METABOLIC PANEL
A/G RATIO: 1.4 (ref 1.2–2.2)
ALBUMIN: 4.1 g/dL (ref 3.5–5.5)
ALK PHOS: 87 IU/L (ref 39–117)
ALT: 24 IU/L (ref 0–32)
AST: 17 IU/L (ref 0–40)
BILIRUBIN TOTAL: 0.2 mg/dL (ref 0.0–1.2)
BUN / CREAT RATIO: 18 (ref 9–23)
BUN: 14 mg/dL (ref 6–24)
CHLORIDE: 104 mmol/L (ref 96–106)
CO2: 22 mmol/L (ref 18–29)
Calcium: 9.6 mg/dL (ref 8.7–10.2)
Creatinine, Ser: 0.77 mg/dL (ref 0.57–1.00)
GFR calc Af Amer: 103 mL/min/{1.73_m2} (ref >59)
GFR calc non Af Amer: 89 mL/min/{1.73_m2} (ref >59)
GLUCOSE: 99 mg/dL (ref 65–99)
Globulin, Total: 2.9 g/dL (ref 1.5–4.5)
POTASSIUM: 4.3 mmol/L (ref 3.5–5.2)
Sodium: 142 mmol/L (ref 134–144)
Total Protein: 7 g/dL (ref 6.0–8.5)

## 2015-12-23 LAB — LIPID PANEL
Chol/HDL Ratio: 4.6 ratio units — ABNORMAL HIGH (ref 0.0–4.4)
Cholesterol, Total: 142 mg/dL (ref 100–199)
HDL: 31 mg/dL — AB (ref >39)
LDL Calculated: 104 mg/dL — ABNORMAL HIGH (ref 0–99)
Triglycerides: 36 mg/dL (ref 0–149)
VLDL Cholesterol Cal: 7 mg/dL (ref 5–40)

## 2015-12-23 LAB — HEMOGLOBIN A1C
ESTIMATED AVERAGE GLUCOSE: 128 mg/dL
Hgb A1c MFr Bld: 6.1 % — ABNORMAL HIGH (ref 4.8–5.6)

## 2015-12-23 LAB — CBC
Hematocrit: 40 % (ref 34.0–46.6)
Hemoglobin: 12.7 g/dL (ref 11.1–15.9)
MCH: 27.3 pg (ref 26.6–33.0)
MCHC: 31.8 g/dL (ref 31.5–35.7)
MCV: 86 fL (ref 79–97)
PLATELETS: 292 10*3/uL (ref 150–379)
RBC: 4.66 x10E6/uL (ref 3.77–5.28)
RDW: 13.3 % (ref 12.3–15.4)
WBC: 4.8 10*3/uL (ref 3.4–10.8)

## 2015-12-23 LAB — VITAMIN D 25 HYDROXY (VIT D DEFICIENCY, FRACTURES): Vit D, 25-Hydroxy: 35.1 ng/mL (ref 30.0–100.0)

## 2015-12-23 LAB — TSH: TSH: 1.19 u[IU]/mL (ref 0.450–4.500)

## 2015-12-23 NOTE — Telephone Encounter (Signed)
Pt aware of labs, needs to cut carbs and increase activity and try to lose some weight

## 2015-12-30 ENCOUNTER — Ambulatory Visit (HOSPITAL_COMMUNITY)
Admission: RE | Admit: 2015-12-30 | Discharge: 2015-12-30 | Disposition: A | Discharge status: Home / Self Care | Payer: BLUE CROSS/BLUE SHIELD | Source: Ambulatory Visit | Attending: Adult Health | Admitting: Adult Health

## 2015-12-30 DIAGNOSIS — Z1231 Encounter for screening mammogram for malignant neoplasm of breast: Secondary | ICD-10-CM | POA: Diagnosis present

## 2016-11-17 ENCOUNTER — Other Ambulatory Visit: Payer: Self-pay | Admitting: Adult Health

## 2016-11-17 DIAGNOSIS — Z1231 Encounter for screening mammogram for malignant neoplasm of breast: Secondary | ICD-10-CM

## 2017-01-02 ENCOUNTER — Encounter: Payer: Self-pay | Admitting: Adult Health

## 2017-01-02 ENCOUNTER — Ambulatory Visit (HOSPITAL_COMMUNITY)
Admission: RE | Admit: 2017-01-02 | Discharge: 2017-01-02 | Disposition: A | Discharge status: Home / Self Care | Payer: BLUE CROSS/BLUE SHIELD | Source: Ambulatory Visit | Attending: Adult Health | Admitting: Adult Health

## 2017-01-02 ENCOUNTER — Ambulatory Visit (INDEPENDENT_AMBULATORY_CARE_PROVIDER_SITE_OTHER): Payer: BLUE CROSS/BLUE SHIELD | Admitting: Adult Health

## 2017-01-02 VITALS — BP 130/80 | HR 92 | Ht 67.2 in | Wt 228.0 lb

## 2017-01-02 DIAGNOSIS — Z1231 Encounter for screening mammogram for malignant neoplasm of breast: Secondary | ICD-10-CM | POA: Diagnosis present

## 2017-01-02 DIAGNOSIS — Z1211 Encounter for screening for malignant neoplasm of colon: Secondary | ICD-10-CM

## 2017-01-02 DIAGNOSIS — Z1212 Encounter for screening for malignant neoplasm of rectum: Secondary | ICD-10-CM

## 2017-01-02 DIAGNOSIS — R7309 Other abnormal glucose: Secondary | ICD-10-CM | POA: Diagnosis not present

## 2017-01-02 DIAGNOSIS — Z975 Presence of (intrauterine) contraceptive device: Secondary | ICD-10-CM

## 2017-01-02 DIAGNOSIS — Z01419 Encounter for gynecological examination (general) (routine) without abnormal findings: Secondary | ICD-10-CM

## 2017-01-02 DIAGNOSIS — Z01411 Encounter for gynecological examination (general) (routine) with abnormal findings: Secondary | ICD-10-CM | POA: Diagnosis not present

## 2017-01-02 LAB — HEMOCCULT GUIAC POC 1CARD (OFFICE): Fecal Occult Blood, POC: NEGATIVE

## 2017-01-02 NOTE — Progress Notes (Signed)
Patient ID: Leslie PinksGloria Spears, female   DOB: Oct 17, 1965, 51 y.o.   MRN: [id number] History of Present Illness:  Leslie Spears is a 51 year old black female in for well woman gyn exam, she had normal pap with negative HPV 12/12/14. PCP is [facility].   Current Medications, Allergies, Past Medical History, Past Surgical History, Family History and Social History were reviewed in [facility] electronic medical record.     Review of Systems:  Patient denies any headaches, hearing loss, fatigue, blurred vision, shortness of breath, chest pain, abdominal pain, problems with bowel movements, urination, or intercourse. No joint pain or mood swings.   Physical Exam:BP 130/80 (BP Location: Left Arm, Patient Position: Sitting, Cuff Size: Large)   Pulse 92   Ht 5' 7.2" (1.707 m)   Wt 228 lb (103.4 kg)   BMI 35.50 kg/m  General:  Well developed, well nourished, no acute distress Skin:  Warm and dry Neck:  Midline trachea, normal thyroid, good ROM, no lymphadenopathy Lungs; Clear to auscultation bilaterally Breast:  No dominant palpable mass, retraction, or nipple discharge Cardiovascular: Regular rate and rhythm Abdomen:  Soft, non tender, no hepatosplenomegaly Pelvic:  External genitalia is normal in appearance, no lesions.  The vagina is normal in appearance. Urethra has no lesions or masses. The cervix is bulbous. +IUD strings at os. Uterus is felt to be normal size, shape, and contour.  No adnexal masses or tenderness noted.Bladder is non tender, no masses felt. Rectal: Good sphincter tone, no polyps, or hemorrhoids felt.  Hemoccult negative. Extremities/musculoskeletal:  No swelling or varicosities noted, no clubbing or cyanosis Psych:  No mood changes, alert and cooperative,seems happy PHQ 9 score 0.  Impression: 1. Well female exam with routine gynecological exam   2. IUD (intrauterine device) in place   3. Elevated hemoglobin A1c   4. Screening for colorectal cancer       Plan: Check  A1c Return in October for IUD removal Pap and physical in 1 year, [facility] and yearly Colonoscopy per GI

## 2017-01-03 ENCOUNTER — Telehealth: Payer: Self-pay | Admitting: Adult Health

## 2017-01-03 LAB — HEMOGLOBIN A1C
Est. average glucose Bld gHb Est-mCnc: 128 mg/dL
Hgb A1c MFr Bld: 6.1 % — ABNORMAL HIGH (ref 4.8–5.6)

## 2017-01-03 NOTE — Telephone Encounter (Signed)
Pt aware A1c 6.1 same as last year, work on weight and cut carbs

## 2018-01-15 ENCOUNTER — Other Ambulatory Visit: Payer: Self-pay | Admitting: Adult Health

## 2018-01-15 DIAGNOSIS — Z1231 Encounter for screening mammogram for malignant neoplasm of breast: Secondary | ICD-10-CM

## 2018-01-17 ENCOUNTER — Ambulatory Visit (HOSPITAL_COMMUNITY): Payer: BLUE CROSS/BLUE SHIELD

## 2018-01-26 ENCOUNTER — Ambulatory Visit (HOSPITAL_COMMUNITY)
Admission: RE | Admit: 2018-01-26 | Discharge: 2018-01-26 | Disposition: A | Discharge status: Home / Self Care | Payer: BLUE CROSS/BLUE SHIELD | Source: Ambulatory Visit | Attending: Adult Health | Admitting: Adult Health

## 2018-01-26 ENCOUNTER — Encounter (HOSPITAL_COMMUNITY): Payer: Self-pay

## 2018-01-26 DIAGNOSIS — Z1231 Encounter for screening mammogram for malignant neoplasm of breast: Secondary | ICD-10-CM | POA: Diagnosis not present

## 2018-01-29 ENCOUNTER — Other Ambulatory Visit (HOSPITAL_COMMUNITY): Payer: Self-pay | Admitting: Adult Health

## 2018-01-29 DIAGNOSIS — R928 Other abnormal and inconclusive findings on diagnostic imaging of breast: Secondary | ICD-10-CM

## 2018-01-30 ENCOUNTER — Ambulatory Visit (INDEPENDENT_AMBULATORY_CARE_PROVIDER_SITE_OTHER): Payer: BLUE CROSS/BLUE SHIELD | Admitting: Adult Health

## 2018-01-30 ENCOUNTER — Other Ambulatory Visit (HOSPITAL_COMMUNITY): Payer: Self-pay | Admitting: Adult Health

## 2018-01-30 ENCOUNTER — Encounter: Payer: Self-pay | Admitting: Adult Health

## 2018-01-30 ENCOUNTER — Other Ambulatory Visit (HOSPITAL_COMMUNITY)
Admission: RE | Admit: 2018-01-30 | Discharge: 2018-01-30 | Disposition: A | Discharge status: Home / Self Care | Payer: BLUE CROSS/BLUE SHIELD | Source: Ambulatory Visit | Attending: Adult Health | Admitting: Adult Health

## 2018-01-30 VITALS — BP 121/70 | HR 51 | Ht 67.2 in | Wt 231.0 lb

## 2018-01-30 DIAGNOSIS — R7309 Other abnormal glucose: Secondary | ICD-10-CM

## 2018-01-30 DIAGNOSIS — Z780 Asymptomatic menopausal state: Secondary | ICD-10-CM | POA: Insufficient documentation

## 2018-01-30 DIAGNOSIS — Z01419 Encounter for gynecological examination (general) (routine) without abnormal findings: Secondary | ICD-10-CM | POA: Insufficient documentation

## 2018-01-30 DIAGNOSIS — Z1212 Encounter for screening for malignant neoplasm of rectum: Secondary | ICD-10-CM

## 2018-01-30 DIAGNOSIS — Z1321 Encounter for screening for nutritional disorder: Secondary | ICD-10-CM | POA: Insufficient documentation

## 2018-01-30 DIAGNOSIS — Z30432 Encounter for removal of intrauterine contraceptive device: Secondary | ICD-10-CM

## 2018-01-30 DIAGNOSIS — R928 Other abnormal and inconclusive findings on diagnostic imaging of breast: Secondary | ICD-10-CM | POA: Insufficient documentation

## 2018-01-30 DIAGNOSIS — Z1211 Encounter for screening for malignant neoplasm of colon: Secondary | ICD-10-CM | POA: Diagnosis not present

## 2018-01-30 LAB — HEMOCCULT GUIAC POC 1CARD (OFFICE): Fecal Occult Blood, POC: NEGATIVE

## 2018-01-30 NOTE — Progress Notes (Signed)
Patient ID: [name], female   DOB: [birth year], 52 y.o.   MRN: [id number] History of Present Illness: Leslie BondsGloria is a 52 year old black female, married, in for well woman gyn exam and pap. She has IUD was placed in 2014, no periods,some hot flashes. 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  PCP is Dr Margo AyeHall.   Current Medications, Allergies, Past Medical History, Past Surgical History, Family History and Social History were reviewed in [facility] electronic medical record.     Review of Systems:  Patient denies any headaches, hearing loss, fatigue, blurred vision, shortness of breath, chest pain, abdominal pain, problems with bowel movements, urination, or intercourse. No joint pain or mood swings. Some hot flashes.  Physical Exam:BP 121/70 (BP Location: Left Arm, Patient Position: Sitting, Cuff Size: Large)   Pulse (!) 51   Ht 5' 7.2" (1.707 m)   Wt 231 lb (104.8 kg)   BMI 35.96 kg/m Consent signed for IUD removal.  General:  Well developed, well nourished, no acute distress Skin:  Warm and dry Neck:  Midline trachea, normal thyroid, good ROM, no lymphadenopathy Lungs; Clear to auscultation bilaterally Breast:  No dominant palpable mass, retraction, or nipple discharge Cardiovascular: Regular rate and rhythm Abdomen:  Soft, non tender, no hepatosplenomegaly Pelvic:  External genitalia is normal in appearance, no lesions.  The vagina is normal in appearance. Urethra has no lesions or masses. The cervix is bulbous. Pap with HPV performed, +IUD strings at os, pt asked to cough and IUD strings grasped with forceps and easily removed. Uterus is felt to be normal size, shape, and contour.  No adnexal masses or tenderness noted.Bladder is non tender, no masses felt. Rectal: Good sphincter tone, no polyps, or hemorrhoids felt.  Hemoccult negative. Extremities/musculoskeletal:  No swelling or varicosities noted, no clubbing or cyanosis Psych:  No mood  changes, alert and cooperative,seems happy PHQ 2 score 0. Fall risk is low. Examination chaperoned by Federico FlakePeggy Dones CMA. Will check FSH in 1 month to see if PM.   Impression: 1. Encounter for gynecological examination with Papanicolaou smear of cervix   2. Screening for colorectal cancer   3. Encounter for IUD removal   4. Abnormal mammogram of right breast   5. Menopause   6. Elevated hemoglobin A1c   7. Encounter for vitamin deficiency screening       Plan: [facility]  [facility] 03/01/18 Check CBC,CMP,TSH and lipids,A1c and vitamin D To get F/U mammogram 02/13/18 at 3 [facility] Physical in 1 year Pap in 3 if normal Will talk when labs back

## 2018-01-30 NOTE — Patient Instructions (Signed)
Use condoms Get [facility] 03/01/18

## 2018-01-31 LAB — CBC
HEMOGLOBIN: 12.6 g/dL (ref 11.1–15.9)
Hematocrit: 40.2 % (ref 34.0–46.6)
MCH: 26.4 pg — ABNORMAL LOW (ref 26.6–33.0)
MCHC: 31.3 g/dL — AB (ref 31.5–35.7)
MCV: 84 fL (ref 79–97)
Platelets: 275 10*3/uL (ref 150–450)
RBC: 4.78 x10E6/uL (ref 3.77–5.28)
RDW: 12.1 % — ABNORMAL LOW (ref 12.3–15.4)
WBC: 4.4 10*3/uL (ref 3.4–10.8)

## 2018-01-31 LAB — LIPID PANEL
CHOL/HDL RATIO: 5.6 ratio — AB (ref 0.0–4.4)
Cholesterol, Total: 163 mg/dL (ref 100–199)
HDL: 29 mg/dL — AB (ref >39)
LDL CALC: 125 mg/dL — AB (ref 0–99)
Triglycerides: 46 mg/dL (ref 0–149)
VLDL CHOLESTEROL CAL: 9 mg/dL (ref 5–40)

## 2018-01-31 LAB — COMPREHENSIVE METABOLIC PANEL
ALBUMIN: 4.1 g/dL (ref 3.5–5.5)
ALK PHOS: 90 IU/L (ref 39–117)
ALT: 23 IU/L (ref 0–32)
AST: 17 IU/L (ref 0–40)
Albumin/Globulin Ratio: 1.4 (ref 1.2–2.2)
BUN / CREAT RATIO: 13 (ref 9–23)
BUN: 10 mg/dL (ref 6–24)
Bilirubin Total: 0.3 mg/dL (ref 0.0–1.2)
CO2: 23 mmol/L (ref 20–29)
CREATININE: 0.8 mg/dL (ref 0.57–1.00)
Calcium: 9.7 mg/dL (ref 8.7–10.2)
Chloride: 108 mmol/L — ABNORMAL HIGH (ref 96–106)
GFR, EST AFRICAN AMERICAN: 97 mL/min/{1.73_m2} (ref >59)
GFR, EST NON AFRICAN AMERICAN: 84 mL/min/{1.73_m2} (ref >59)
GLOBULIN, TOTAL: 3 g/dL (ref 1.5–4.5)
GLUCOSE: 111 mg/dL — AB (ref 65–99)
Potassium: 4.4 mmol/L (ref 3.5–5.2)
SODIUM: 144 mmol/L (ref 134–144)
TOTAL PROTEIN: 7.1 g/dL (ref 6.0–8.5)

## 2018-01-31 LAB — TSH: TSH: 1.12 u[IU]/mL (ref 0.450–4.500)

## 2018-01-31 LAB — VITAMIN D 25 HYDROXY (VIT D DEFICIENCY, FRACTURES): Vit D, 25-Hydroxy: 21.9 ng/mL — ABNORMAL LOW (ref 30.0–100.0)

## 2018-01-31 LAB — HEMOGLOBIN A1C
ESTIMATED AVERAGE GLUCOSE: 128 mg/dL
HEMOGLOBIN A1C: 6.1 % — AB (ref 4.8–5.6)

## 2018-02-01 ENCOUNTER — Telehealth: Payer: Self-pay | Admitting: Adult Health

## 2018-02-01 NOTE — Telephone Encounter (Signed)
Pt aware of labs, decrease carbs and fats, is going to [employer] now, and take 5000 IU vitamin D

## 2018-02-02 LAB — CYTOLOGY - PAP
Diagnosis: NEGATIVE
HPV: NOT DETECTED

## 2018-02-13 ENCOUNTER — Other Ambulatory Visit (HOSPITAL_COMMUNITY): Payer: BLUE CROSS/BLUE SHIELD

## 2018-02-13 ENCOUNTER — Ambulatory Visit (HOSPITAL_COMMUNITY)
Admission: RE | Admit: 2018-02-13 | Discharge: 2018-02-13 | Disposition: A | Discharge status: Home / Self Care | Payer: PRIVATE HEALTH INSURANCE | Source: Ambulatory Visit | Attending: Adult Health | Admitting: Adult Health

## 2018-02-13 DIAGNOSIS — R928 Other abnormal and inconclusive findings on diagnostic imaging of breast: Secondary | ICD-10-CM

## 2018-03-02 LAB — FOLLICLE STIMULATING HORMONE: FSH: 82.4 m[IU]/mL

## 2018-03-05 ENCOUNTER — Telehealth: Payer: Self-pay | Admitting: Adult Health

## 2018-03-05 NOTE — Telephone Encounter (Signed)
Pt aware that FSH 82.4

## 2019-04-29 ENCOUNTER — Ambulatory Visit: Payer: Self-pay | Attending: Internal Medicine

## 2019-04-29 DIAGNOSIS — Z23 Encounter for immunization: Secondary | ICD-10-CM

## 2019-04-29 NOTE — Progress Notes (Signed)
   Covid-19 Vaccination Clinic  Name:  Leslie Spears    MRN: [id number] DOB: 1965-11-11  04/29/2019  [name] was observed post Covid-19 immunization for 15 minutes without incident. She was provided with Vaccine Information Sheet and instruction to access the V-Safe system.   [name] was instructed to call 911 with any severe reactions post vaccine: Marland Kitchen Difficulty breathing  . Swelling of face and throat  . A fast heartbeat  . A bad rash all over body  . Dizziness and weakness   Immunizations Administered    Name Date Dose VIS Date Route   Pfizer COVID-19 Vaccine 04/29/2019  2:22 PM 0.3 mL 01/11/2019 Intramuscular   Manufacturer: [employer], [employer]   Lot: [id number]   NDC: [address]-7

## 2019-05-06 ENCOUNTER — Ambulatory Visit: Payer: PRIVATE HEALTH INSURANCE

## 2019-05-28 ENCOUNTER — Ambulatory Visit: Payer: Self-pay | Attending: Internal Medicine

## 2019-05-28 DIAGNOSIS — Z23 Encounter for immunization: Secondary | ICD-10-CM

## 2019-05-28 NOTE — Progress Notes (Signed)
   Covid-19 Vaccination Clinic  Name:  Leslie Spears    MRN: [id number] DOB: March 12, 1965  05/28/2019  [name] was observed post Covid-19 immunization for 15 minutes without incident. She was provided with Vaccine Information Sheet and instruction to access the V-Safe system.   [name] was instructed to call 911 with any severe reactions post vaccine: Marland Kitchen Difficulty breathing  . Swelling of face and throat  . A fast heartbeat  . A bad rash all over body  . Dizziness and weakness   Immunizations Administered    Name Date Dose VIS Date Route   Pfizer COVID-19 Vaccine 05/28/2019  9:21 AM 0.3 mL 03/27/2018 Intramuscular   Manufacturer: [employer], [employer]   Lot: [id number]   NDC: [address]-3

## 2019-10-21 ENCOUNTER — Other Ambulatory Visit (HOSPITAL_COMMUNITY): Payer: Self-pay | Admitting: Adult Health

## 2019-10-21 DIAGNOSIS — Z1231 Encounter for screening mammogram for malignant neoplasm of breast: Secondary | ICD-10-CM

## 2019-12-04 ENCOUNTER — Other Ambulatory Visit: Payer: Self-pay | Admitting: Adult Health

## 2020-01-02 ENCOUNTER — Ambulatory Visit (INDEPENDENT_AMBULATORY_CARE_PROVIDER_SITE_OTHER): Payer: 59 | Admitting: Adult Health

## 2020-01-02 ENCOUNTER — Encounter: Payer: Self-pay | Admitting: Adult Health

## 2020-01-02 ENCOUNTER — Other Ambulatory Visit: Payer: Self-pay

## 2020-01-02 VITALS — BP 137/74 | HR 60 | Ht 65.5 in | Wt 226.0 lb

## 2020-01-02 DIAGNOSIS — Z01419 Encounter for gynecological examination (general) (routine) without abnormal findings: Secondary | ICD-10-CM | POA: Diagnosis not present

## 2020-01-02 DIAGNOSIS — Z1211 Encounter for screening for malignant neoplasm of colon: Secondary | ICD-10-CM | POA: Diagnosis not present

## 2020-01-02 LAB — HEMOCCULT GUIAC POC 1CARD (OFFICE): Fecal Occult Blood, POC: NEGATIVE

## 2020-01-02 NOTE — Progress Notes (Addendum)
  Subjective:     Patient ID: Leslie Spears, female   DOB: Dec 08, 1965, 54 y.o.   MRN: [id number]  HPI Leslie Spears is a 54 year old black female, married,PM in for pelvic exam, she had physical and labs with PCP. She said she had low vitamin D and is prediabetic, so taking 6000 IU vitamin D and fasting from 8 pm to 11 am and has lost 3 lbs in 2 weeks,and she thinks she feel better. She works for her [employer], so she is active.  PCP is Dr Margo Aye.  Review of Systems Patient denies any headaches, hearing loss, fatigue, blurred vision, shortness of breath, chest pain, abdominal pain, problems with bowel movements, urination, or intercourse. No joint pain or mood swings. Denies any vaginal bleeding. Reviewed past medical,surgical, social and family history. Reviewed medications and allergies.     Objective:   Physical Exam BP 137/74 (BP Location: Left Arm, Patient Position: Sitting, Cuff Size: Large)   Pulse 60   Ht 5' 5.5" (1.664 m)   Wt 226 lb (102.5 kg)   BMI 37.04 kg/m    Skin warm and dry.Pelvic: external genitalia is normal in appearance no lesions, vagina: pink with fair moisture,urethra has no lesions or masses noted, cervix:smooth, uterus: normal size, shape and contour, non tender, no masses felt, adnexa: no masses or tenderness noted. Bladder is non tender and no masses felt. Rectal exam:no masses felt, has good tone, and hemoccult was negative. AA is 0  Fall risk is low PHQ 9 score is 7, no SI, declines meds, has just had sadness with several people dying her in family  GAD 7 score is 6   [facility] - 01/02/20 0844      Pregnancy Intention Screening   Does the patient want to become pregnant in the next year? N/A    Does the patient's partner want to become pregnant in the next year? N/A    Would the patient like to discuss contraceptive options today? N/A      Contraception Wrap Up   Current Method No Method - Other Reason   postmenopausal   End Method No  Method - Other Reason   postmenopausal   Contraception Counseling Provided No         Examination chaperoned by Malachy Mood LPN   Assessment:     1. Normal pelvic exam Pap in 1 year Physical with PCP Labs with PCP Mammogram yearly Colonoscopy per GI  2. Encounter for screening fecal occult blood testing     Plan:     Handout given on Vitamin D def She will get me a copy of her labs Pap in 1 year

## 2020-01-02 NOTE — Patient Instructions (Signed)
Vitamin D Deficiency Vitamin D deficiency is when your body does not have enough vitamin D. Vitamin D is important to your body because:  It helps your body use other minerals.  It helps to keep your bones strong and healthy.  It may help to prevent some diseases.  It helps your heart and other muscles work well. Not getting enough vitamin D can make your bones soft. It can also cause other health problems. What are the causes? This condition may be caused by:  Not eating enough foods that contain vitamin D.  Not getting enough sun.  Having diseases that make it hard for your body to absorb vitamin D.  Having a surgery in which a part of the stomach or a part of the small intestine is removed.  Having kidney disease or liver disease. What increases the risk? You are more likely to get this condition if:  You are older.  You do not spend much time outdoors.  You live in a nursing home.  You have had broken bones.  You have weak or thin bones (osteoporosis).  You have a disease or condition that changes how your body absorbs vitamin D.  You have dark skin.  You take certain medicines.  You are overweight or obese. What are the signs or symptoms?  In mild cases, there may not be any symptoms. If the condition is very bad, symptoms may include: ? Bone pain. ? Muscle pain. ? Falling often. ? Broken bones caused by a minor injury. How is this treated? Treatment may include taking supplements as told by your doctor. Your doctor will tell you what dose is best for you. Supplements may include:  Vitamin D.  Calcium. Follow these instructions at home: Eating and drinking   Eat foods that contain vitamin D, such as: ? Dairy products, cereals, or juices with added vitamin D. Check the label. ? Fish, such as salmon or trout. ? Eggs. ? Oysters. ? Mushrooms. The items listed above may not be a complete list of what you can eat and drink. Contact a dietitian for more  options. General instructions  Take medicines and supplements only as told by your doctor.  Get regular, safe exposure to natural sunlight.  Do not use a tanning bed.  Maintain a healthy weight. Lose weight if needed.  Keep all follow-up visits as told by your doctor. This is important. How is this prevented?  You can get vitamin D by: ? Eating foods that naturally contain vitamin D. ? Eating or drinking products that have vitamin D added to them, such as cereals, juices, and milk. ? Taking vitamin D or a multivitamin that contains vitamin D. ? Being in the sun. Your body makes vitamin D when your skin is exposed to sunlight. Your body changes the sunlight into a form of the vitamin that it can use. Contact a doctor if:  Your symptoms do not go away.  You feel sick to your stomach (nauseous).  You throw up (vomit).  You poop less often than normal, or you have trouble pooping (constipation). Summary  Vitamin D deficiency is when your body does not have enough vitamin D.  Vitamin D helps to keep your bones strong and healthy.  This condition is often treated by taking a supplement.  Your doctor will tell you what dose is best for you. This information is not intended to replace advice given to you by your health care provider. Make sure you discuss any questions you   have with your health care provider. Document Revised: 09/25/2017 Document Reviewed: 09/25/2017 Elsevier Patient Education  2020 Elsevier Inc.  

## 2020-02-05 ENCOUNTER — Ambulatory Visit (HOSPITAL_COMMUNITY): Payer: 59

## 2020-04-09 ENCOUNTER — Ambulatory Visit (HOSPITAL_COMMUNITY)
Admission: RE | Admit: 2020-04-09 | Discharge: 2020-04-09 | Disposition: A | Discharge status: Home / Self Care | Payer: 59 | Source: Ambulatory Visit | Attending: Adult Health | Admitting: Adult Health

## 2020-04-09 ENCOUNTER — Other Ambulatory Visit: Payer: Self-pay

## 2020-04-09 DIAGNOSIS — Z1231 Encounter for screening mammogram for malignant neoplasm of breast: Secondary | ICD-10-CM | POA: Insufficient documentation

## 2021-01-05 ENCOUNTER — Encounter: Payer: Self-pay | Admitting: Adult Health

## 2021-01-05 ENCOUNTER — Ambulatory Visit (INDEPENDENT_AMBULATORY_CARE_PROVIDER_SITE_OTHER): Payer: 59 | Admitting: Adult Health

## 2021-01-05 ENCOUNTER — Other Ambulatory Visit (HOSPITAL_COMMUNITY)
Admission: RE | Admit: 2021-01-05 | Discharge: 2021-01-05 | Disposition: A | Discharge status: Home / Self Care | Payer: 59 | Source: Ambulatory Visit | Attending: Adult Health | Admitting: Adult Health

## 2021-01-05 VITALS — BP 143/78 | HR 51 | Ht 65.5 in | Wt 228.0 lb

## 2021-01-05 DIAGNOSIS — Z1211 Encounter for screening for malignant neoplasm of colon: Secondary | ICD-10-CM

## 2021-01-05 DIAGNOSIS — Z01419 Encounter for gynecological examination (general) (routine) without abnormal findings: Secondary | ICD-10-CM | POA: Insufficient documentation

## 2021-01-05 DIAGNOSIS — Z780 Asymptomatic menopausal state: Secondary | ICD-10-CM | POA: Insufficient documentation

## 2021-01-05 LAB — HEMOCCULT GUIAC POC 1CARD (OFFICE): Fecal Occult Blood, POC: NEGATIVE

## 2021-01-05 NOTE — Progress Notes (Signed)
Patient ID: Leslie Spears, female   DOB: 1965/06/21, 55 y.o.   MRN: [id number] History of Present Illness: Tishana is a 55 year old black female,married, PM in for a well woman gyn exam and pap.  She had visit from [facility] from [facility] yesterday and glucose was 91,BP 118/80, pulse 64. PCP is Dr Margo Aye.  Current Medications, Allergies, Past Medical History, Past Surgical History, Family History and Social History were reviewed in [facility] record.     Review of Systems: Patient denies any headaches, hearing loss, blurred vision, shortness of breath, chest pain, abdominal pain, problems with bowel movements, urination, or intercourse. No joint pain or mood swings. Tired at times, still having some hot flashes and joints ache with weather change. Denies any vaginal bleeding.   Physical Exam:BP (!) 143/78 (BP Location: Right Arm, Patient Position: Sitting, Cuff Size: Large)   Pulse (!) 51   Ht 5' 5.5" (1.664 m)   Wt 228 lb (103.4 kg)   BMI 37.36 kg/m   General:  Well developed, well nourished, no acute distress Skin:  Warm and dry Neck:  Midline trachea, normal thyroid, good ROM, no lymphadenopathy Lungs; Clear to auscultation bilaterally Breast:  No dominant palpable mass, retraction, or nipple discharge Cardiovascular: Regular rate and rhythm Abdomen:  Soft, non tender, no hepatosplenomegaly Pelvic:  External genitalia is normal in appearance, no lesions.  The vagina is pale with loss of rugae. Urethra has no lesions or masses. The cervix is smooth, pap with HR HPV genotyping performed.  Uterus is felt to be normal size, shape, and contour.  No adnexal masses or tenderness noted.Bladder is non tender, no masses felt. Rectal: Good sphincter tone, no polyps, or hemorrhoids felt.  Hemoccult negative. Extremities/musculoskeletal:  No swelling or varicosities noted, no clubbing or cyanosis Psych:  No mood changes, alert and cooperative,seems happy AA is 1 Fall risk  is low Depression screen [facility] 2/9 01/05/2021 01/02/2020 01/30/2018  Decreased Interest 1 1 0  Down, Depressed, Hopeless 1 1 0  PHQ - 2 Score 2 2 0  Altered sleeping 1 1 -  Tired, decreased energy 1 1 -  Change in appetite 1 1 -  Feeling bad or failure about yourself  1 1 -  Trouble concentrating 1 1 -  Moving slowly or fidgety/restless 0 0 -  Suicidal thoughts 0 0 -  PHQ-9 Score 7 7 -    GAD 7 : Generalized Anxiety Score 01/05/2021 01/02/2020  Nervous, Anxious, on Edge 1 1  Control/stop worrying 1 1  Worry too much - different things 1 1  Trouble relaxing 1 1  Restless 1 0  Easily annoyed or irritable 0 1  Afraid - awful might happen 0 1  Total GAD 7 Score 5 6      [facility] - 01/05/21 0840       Pregnancy Intention Screening   Does the patient want to become pregnant in the next year? N/A    Does the patient's partner want to become pregnant in the next year? N/A    Would the patient like to discuss contraceptive options today? N/A      Contraception Wrap Up   Current Method --   PM   End Method --   PM   Contraception Counseling Provided No             Examination chaperoned by Faith Rogue LPN  Impression and Plan: 1. Encounter for gynecological examination with Papanicolaou smear of cervix Pap sent Physical  in 1 year Pap in 3 if normal Mammogram 2024 Colonoscopy per GI Will let me know about labs  2. Encounter for screening fecal occult blood testing   3. Postmenopause

## 2021-01-07 LAB — CYTOLOGY - PAP
Comment: NEGATIVE
Diagnosis: NEGATIVE
High risk HPV: NEGATIVE

## 2021-04-26 ENCOUNTER — Other Ambulatory Visit (HOSPITAL_COMMUNITY): Payer: Self-pay | Admitting: Internal Medicine

## 2021-04-26 ENCOUNTER — Other Ambulatory Visit (HOSPITAL_COMMUNITY): Payer: Self-pay | Admitting: Adult Health

## 2021-04-26 DIAGNOSIS — Z1231 Encounter for screening mammogram for malignant neoplasm of breast: Secondary | ICD-10-CM

## 2021-04-28 ENCOUNTER — Other Ambulatory Visit: Payer: Self-pay

## 2021-04-28 ENCOUNTER — Ambulatory Visit (HOSPITAL_COMMUNITY)
Admission: RE | Admit: 2021-04-28 | Discharge: 2021-04-28 | Disposition: A | Discharge status: Home / Self Care | Payer: 59 | Source: Ambulatory Visit | Attending: Adult Health | Admitting: Adult Health

## 2021-04-28 DIAGNOSIS — Z1231 Encounter for screening mammogram for malignant neoplasm of breast: Secondary | ICD-10-CM | POA: Diagnosis not present

## 2022-03-03 DIAGNOSIS — Z419 Encounter for procedure for purposes other than remedying health state, unspecified: Secondary | ICD-10-CM | POA: Diagnosis not present

## 2022-03-21 ENCOUNTER — Other Ambulatory Visit (HOSPITAL_COMMUNITY): Payer: Self-pay | Admitting: Adult Health

## 2022-03-21 DIAGNOSIS — Z1231 Encounter for screening mammogram for malignant neoplasm of breast: Secondary | ICD-10-CM

## 2022-04-01 DIAGNOSIS — Z419 Encounter for procedure for purposes other than remedying health state, unspecified: Secondary | ICD-10-CM | POA: Diagnosis not present

## 2022-05-02 ENCOUNTER — Ambulatory Visit (HOSPITAL_COMMUNITY)
Admission: RE | Admit: 2022-05-02 | Discharge: 2022-05-02 | Disposition: A | Discharge status: Home / Self Care | Payer: Medicaid Other | Source: Ambulatory Visit | Attending: Adult Health | Admitting: Adult Health

## 2022-05-02 DIAGNOSIS — Z1231 Encounter for screening mammogram for malignant neoplasm of breast: Secondary | ICD-10-CM

## 2022-05-03 ENCOUNTER — Telehealth: Payer: Self-pay | Admitting: *Deleted

## 2022-05-03 NOTE — Telephone Encounter (Signed)
Pt aware mammogram was normal and to repeat in 1 year. Pt voiced understanding. [place]

## 2022-05-03 NOTE — Telephone Encounter (Signed)
-----   Message from Estill Dooms, NP sent at 05/03/2022  8:42 AM EDT ----- Let her know mammogram was negative, repeat in 1 year. THX

## 2022-05-10 ENCOUNTER — Encounter: Payer: Self-pay | Admitting: Adult Health

## 2022-05-10 ENCOUNTER — Ambulatory Visit (INDEPENDENT_AMBULATORY_CARE_PROVIDER_SITE_OTHER): Payer: Medicaid Other | Admitting: Adult Health

## 2022-05-10 VITALS — BP 143/85 | HR 58 | Ht 67.0 in | Wt 232.5 lb

## 2022-05-10 DIAGNOSIS — Z1211 Encounter for screening for malignant neoplasm of colon: Secondary | ICD-10-CM

## 2022-05-10 DIAGNOSIS — Z01419 Encounter for gynecological examination (general) (routine) without abnormal findings: Secondary | ICD-10-CM | POA: Diagnosis not present

## 2022-05-10 DIAGNOSIS — Z6379 Other stressful life events affecting family and household: Secondary | ICD-10-CM | POA: Diagnosis not present

## 2022-05-10 DIAGNOSIS — Z780 Asymptomatic menopausal state: Secondary | ICD-10-CM | POA: Diagnosis not present

## 2022-05-10 LAB — HEMOCCULT GUIAC POC 1CARD (OFFICE): Fecal Occult Blood, POC: NEGATIVE

## 2022-05-10 NOTE — Progress Notes (Signed)
Patient ID: Leslie Spears, female   DOB: Feb 24, 1965, 57 y.o.   MRN: [id number] History of Present Illness: Leslie Spears is a 57 year old black female, married, PM in for a well woman gyn exam. 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 She says she is tired, and stressed. She has been without insurance but has gotten back.  Last pap was negative HPV and NILM 01/05/21.  PCP is Dr Margo Aye.   Current Medications, Allergies, Past Medical History, Past Surgical History, Family History and Social History were reviewed in [facility] record.     Review of Systems: Patient denies any headaches, hearing loss,  blurred vision, shortness of breath, chest pain, abdominal pain, problems with bowel movements, urination, or intercourse. No joint pain or mood swings.  Denies any vaginal bleeding  See HPI for positives.   Physical Exam:BP (!) 143/85 (BP Location: Right Arm, Patient Position: Sitting, Cuff Size: Large)   Pulse (!) 58   Ht 5\' 7"  (1.702 m)   Wt 232 lb 8 oz (105.5 kg)   BMI 36.41 kg/m   General:  Well developed, well nourished, no acute distress Skin:  Warm and dry Neck:  Midline trachea, normal thyroid, good ROM, no lymphadenopathy Lungs; Clear to auscultation bilaterally Breast:  No dominant palpable mass, retraction, or nipple discharge Cardiovascular: Regular rate and rhythm Abdomen:  Soft, non tender, no hepatosplenomegaly Pelvic:  External genitalia is normal in appearance, no lesions.  The vagina is normal in appearance. Urethra has no lesions or masses. The cervix is bulbous.  Uterus is felt to be normal size, shape, and contour.  No adnexal masses or tenderness noted.Bladder is non tender, no masses felt. Rectal: Good sphincter tone, no polyps, or hemorrhoids felt.  Hemoccult negative. Extremities/musculoskeletal:  No swelling or  varicosities noted, no clubbing or cyanosis Psych:  No mood changes, alert and cooperative,seems happy AA is 1 Fall risk is high    05/10/2022   10:35 AM 01/05/2021    8:39 AM 01/02/2020    8:39 AM  Depression screen PHQ 2/9  Decreased Interest 1 1 1   Down, Depressed, Hopeless 1 1 1   PHQ - 2 Score 2 2 2   Altered sleeping 1 1 1   Tired, decreased energy 2 1 1   Change in appetite 1 1 1   Feeling bad or failure about yourself  1 1 1   Trouble concentrating 1 1 1   Moving slowly or fidgety/restless 0 0 0  Suicidal thoughts 0 0 0  PHQ-9 Score 8 7 7        05/10/2022   10:35 AM 01/05/2021    8:41 AM 01/02/2020    8:40 AM  GAD 7 : Generalized Anxiety Score  Nervous, Anxious, on Edge 3 1 1   Control/stop worrying 3 1 1   Worry too much - different things 3 1 1   Trouble relaxing 2 1 1   Restless 1 1 0  Easily annoyed or irritable 1 0 1  Afraid - awful might happen 1 0 1  Total GAD 7 Score 14 5 6       [facility] - 05/10/22 1046       Pregnancy Intention Screening   Does the patient want to become pregnant in the next year? N/A    Does the patient's partner want to become pregnant in the next year? N/A  Would the patient like to discuss contraceptive options today? N/A      Contraception Wrap Up   Current Method No Method - Other Reason   postmenopausal   Reason for No Current Contraceptive Method at Intake ([facility] Only) Other    End Method No Method - Other Reason   postmenopausal   Contraception Counseling Provided No            Examination chaperoned by Malachy Mood LPN  Impression and Plan: 1. Encounter for well woman exam with routine gynecological exam Pap and physical in 1 year Labs with PCP Mammogram was negative 05/02/22 Colonoscopy per GI   2. Encounter for screening fecal occult blood testing Hemoccult was negative - POCT occult blood stool  3. Postmenopause Denies any bleeding  4. Stress due to illness of family member Se declines any meds for now

## 2023-02-23 ENCOUNTER — Ambulatory Visit (HOSPITAL_COMMUNITY)
Admission: RE | Admit: 2023-02-23 | Discharge: 2023-02-23 | Disposition: A | Discharge status: Home / Self Care | Payer: Medicaid Other | Source: Ambulatory Visit | Attending: Internal Medicine | Admitting: Internal Medicine

## 2023-02-23 ENCOUNTER — Other Ambulatory Visit (HOSPITAL_COMMUNITY): Payer: Self-pay | Admitting: Internal Medicine

## 2023-02-23 DIAGNOSIS — M79661 Pain in right lower leg: Secondary | ICD-10-CM | POA: Insufficient documentation

## 2023-09-23 IMAGING — MG MM DIGITAL SCREENING BILAT W/ TOMO AND CAD
6 of 11 series · 6 of 35 positions shown · non-contrast
Comparison: Previous exam(s).

CLINICAL DATA: Screening.

EXAM:
DIGITAL SCREENING BILATERAL MAMMOGRAM WITH TOMOSYNTHESIS AND CAD
TECHNIQUE: Bilateral screening digital craniocaudal and mediolateral oblique
mammograms were obtained. Bilateral screening digital breast
tomosynthesis was performed. The images were evaluated with
computer-aided detection.

[R CC synth-2D]
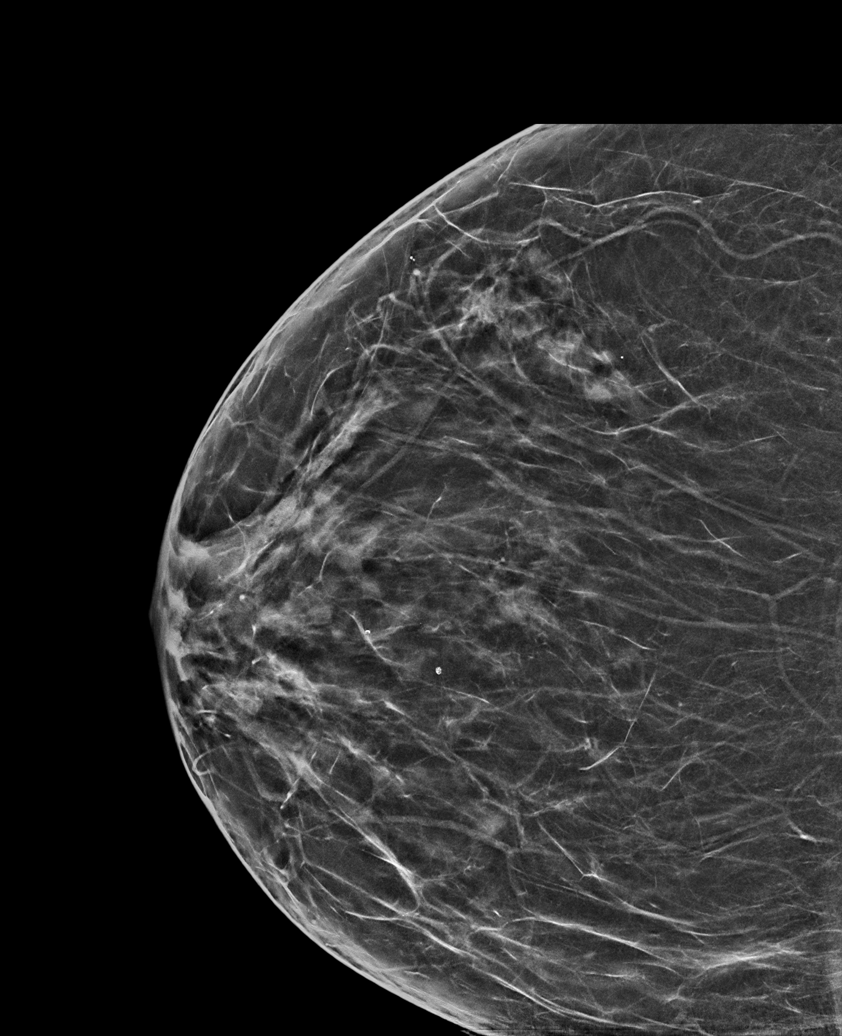

[R CV synth-2D]
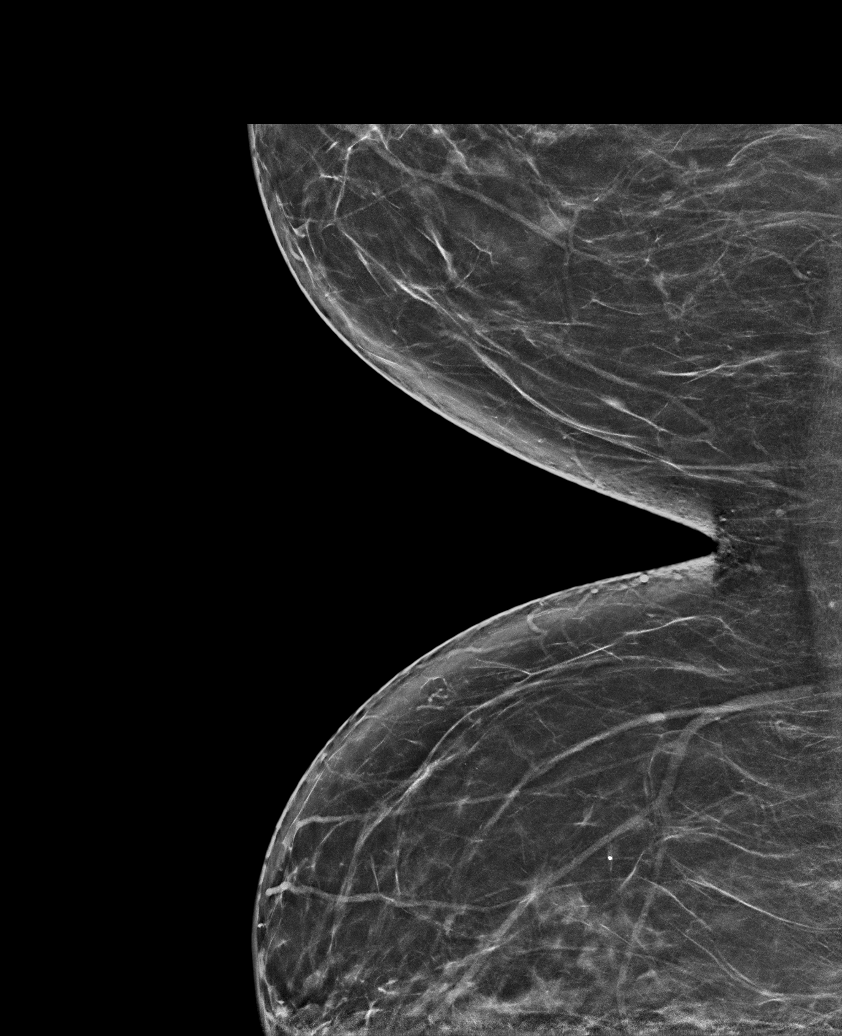

[L CC synth-2D]
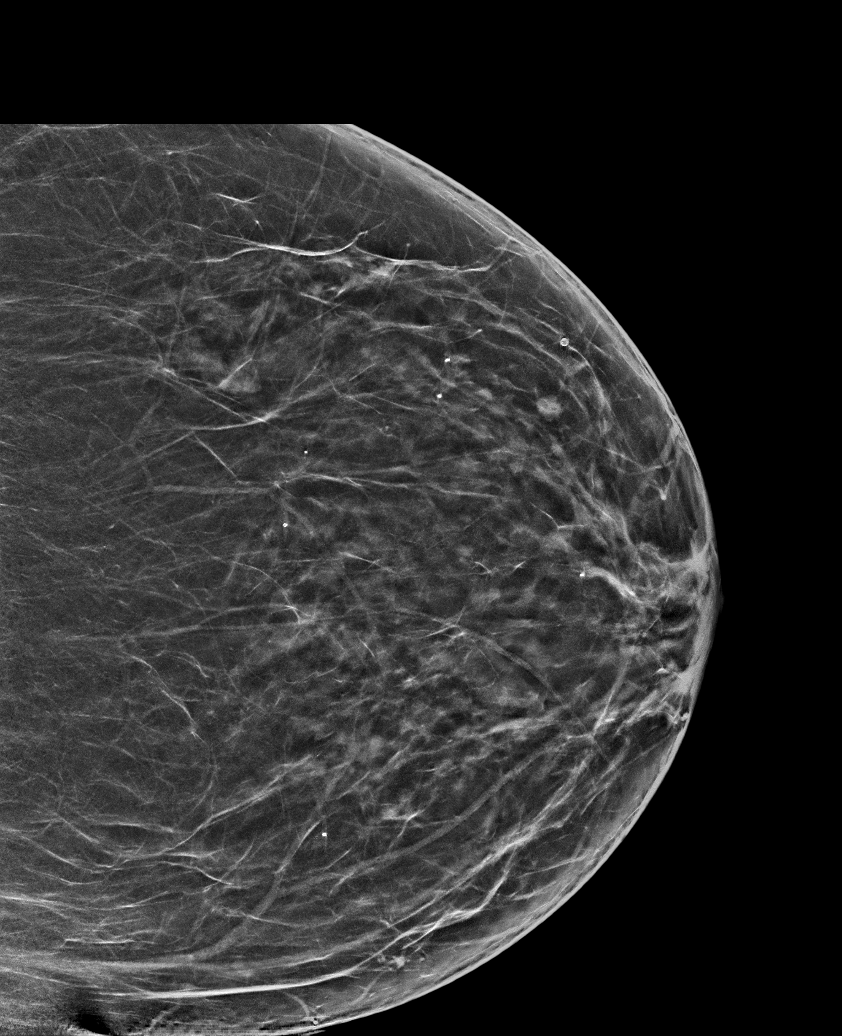

[R MLO synth-2D]
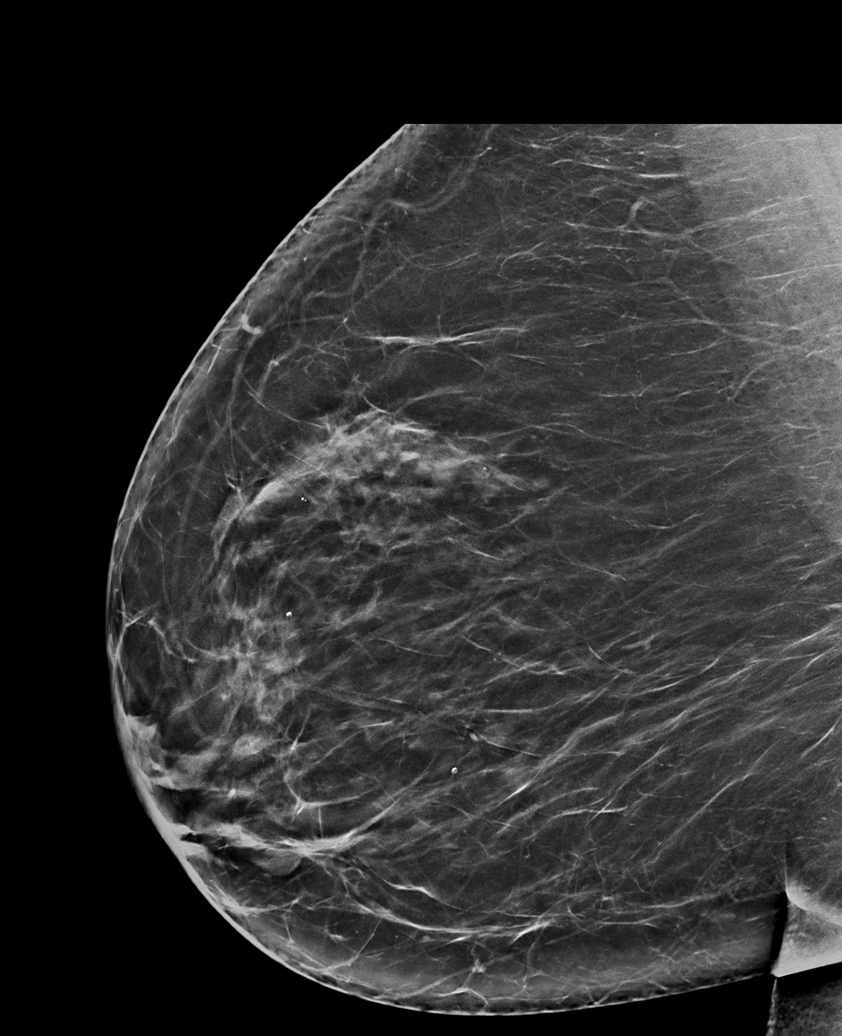

[L MLO synth-2D]
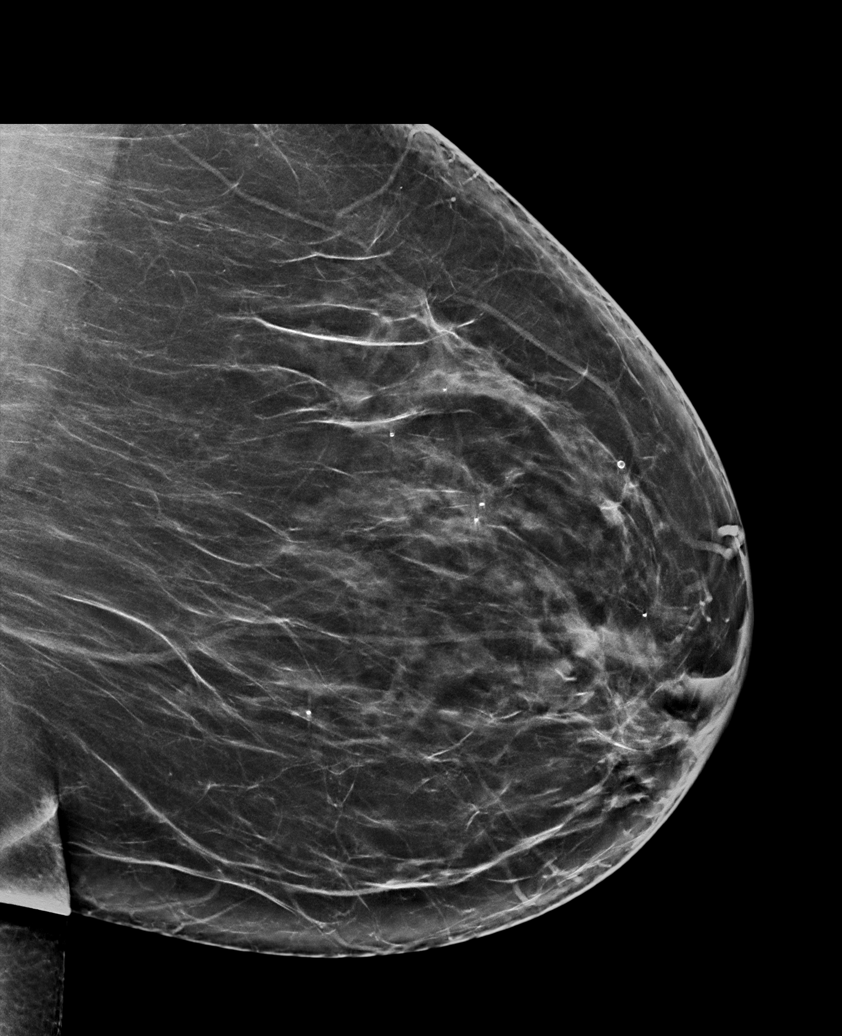

[L CC tomo · tomo slice 36/71.0]
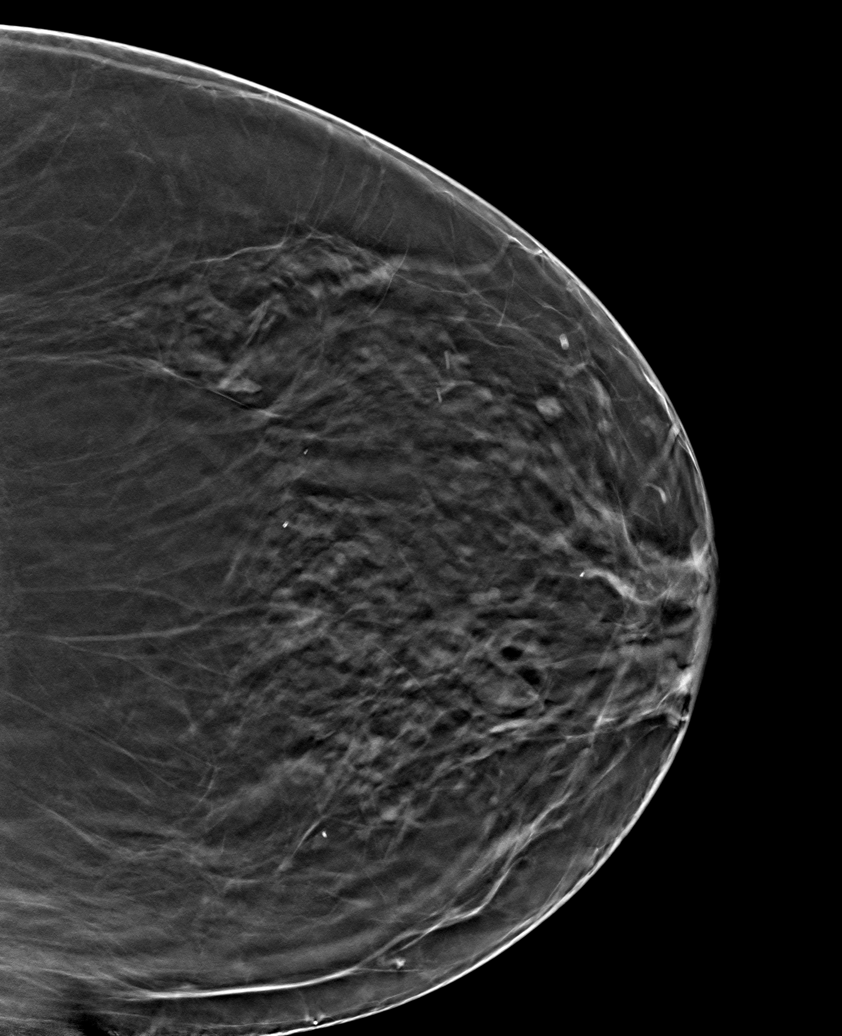

[6 of 35 positions shown; findings below may reference images not displayed]

ACR Breast Density Category b: There are scattered areas of
fibroglandular density.
FINDINGS: There are no findings suspicious for malignancy.
IMPRESSION: No mammographic evidence of malignancy. A result letter of this
screening mammogram will be mailed directly to the patient.

RECOMMENDATION:
Screening mammogram in one year. (Code:51-O-LD2)

BI-RADS CATEGORY  1: Negative.
